# Patient Record
Sex: Female | Born: 1975 | Race: White | Hispanic: No
Health system: Midwestern US, Community
[De-identification: ages and names within clinical notes are randomized; demographics above are authoritative.]

## PROBLEM LIST (undated history)

## (undated) DIAGNOSIS — M797 Fibromyalgia: Secondary | ICD-10-CM

## (undated) DIAGNOSIS — M1990 Unspecified osteoarthritis, unspecified site: Secondary | ICD-10-CM

## (undated) DIAGNOSIS — F419 Anxiety disorder, unspecified: Secondary | ICD-10-CM

## (undated) DIAGNOSIS — J45909 Unspecified asthma, uncomplicated: Secondary | ICD-10-CM

## (undated) DIAGNOSIS — G629 Polyneuropathy, unspecified: Secondary | ICD-10-CM

## (undated) DIAGNOSIS — R69 Illness, unspecified: Secondary | ICD-10-CM

## (undated) DIAGNOSIS — M5137 Other intervertebral disc degeneration, lumbosacral region: Secondary | ICD-10-CM

## (undated) DIAGNOSIS — H6693 Otitis media, unspecified, bilateral: Secondary | ICD-10-CM

## (undated) DIAGNOSIS — F411 Generalized anxiety disorder: Secondary | ICD-10-CM

## (undated) DIAGNOSIS — F32A Depression, unspecified: Secondary | ICD-10-CM

## (undated) DIAGNOSIS — E559 Vitamin D deficiency, unspecified: Secondary | ICD-10-CM

## (undated) HISTORY — PX: OTHER SURGICAL HISTORY: SHX169

## (undated) HISTORY — PX: FACIAL FRACTURE SURGERY: SHX1570

---

## 2007-04-14 LAB — CBC WITH AUTOMATED DIFF
ABS. BASOPHILS: 0.1 10*3/uL (ref 0.0–0.1)
ABS. EOSINOPHILS: 0.2 10*3/uL (ref 0.0–0.4)
ABS. LYMPHOCYTES: 6.5 10*3/uL — ABNORMAL HIGH (ref 0.8–3.5)
ABS. MONOCYTES: 0.7 10*3/uL (ref 0–1.0)
ABS. NEUTROPHILS: 2.9 10*3/uL (ref 1.8–8.0)
BASOPHILS: 1 % (ref 0–1)
EOSINOPHILS: 2 % (ref 0–7)
HCT: 40.6 % (ref 35.0–47.0)
HGB: 13.8 g/dL (ref 11.5–16.0)
LYMPHOCYTES: 62 % — ABNORMAL HIGH (ref 12–49)
MCH: 32.5 PG (ref 26.0–34.0)
MCHC: 34 g/dL (ref 30.0–35.0)
MCV: 95.5 FL (ref 80.0–99.0)
MONOCYTES: 7 % (ref 5–13)
NEUTROPHILS: 28 % — ABNORMAL LOW (ref 32–75)
PLATELET: 232 10*3/uL (ref 150–400)
RBC: 4.25 M/uL (ref 3.80–5.20)
RDW: 12.6 % (ref 11.5–14.5)
WBC: 10.3 10*3/uL (ref 3.6–11.0)

## 2007-04-15 LAB — METABOLIC PANEL, COMPREHENSIVE
A-G Ratio: 0.9 — ABNORMAL LOW (ref 1.1–2.2)
ALT (SGPT): 34 U/L (ref 30–65)
AST (SGOT): 18 U/L (ref 15–37)
Albumin: 3.5 g/dL (ref 3.5–5.0)
Alk. phosphatase: 77 U/L (ref 50–136)
Anion gap: 11 mmol/L (ref 5–15)
BUN/Creatinine ratio: 20 (ref 12–20)
BUN: 16 MG/DL (ref 6–20)
Bilirubin, total: 0.2 MG/DL (ref <1.0)
CO2: 25 MMOL/L (ref 21–32)
Calcium: 9 MG/DL (ref 8.5–10.1)
Chloride: 106 MMOL/L (ref 97–108)
Creatinine: 0.8 MG/DL (ref 0.6–1.3)
GFR est AA: >60 mL/min/{1.73_m2} (ref >60)
GFR est non-AA: >60 mL/min/{1.73_m2} (ref >60)
Globulin: 4.1 g/dL — ABNORMAL HIGH (ref 2.0–4.0)
Glucose: 92 MG/DL (ref 65–105)
Potassium: 3.7 MMOL/L (ref 3.5–5.1)
Protein, total: 7.6 g/dL (ref 6.4–8.2)
Sodium: 142 MMOL/L (ref 136–145)

## 2007-04-15 LAB — URINALYSIS W/ REFLEX CULTURE
Bilirubin: NEGATIVE
Blood: NEGATIVE
Glucose: NEGATIVE MG/DL
Ketone: NEGATIVE MG/DL
Leukocyte Esterase: NEGATIVE
Nitrites: NEGATIVE
Protein: NEGATIVE MG/DL
Specific gravity: >1.03 — ABNORMAL HIGH (ref 1.003–1.030)
Urobilinogen: 0.2 EU/DL (ref 0.2–1.0)
pH (UA): 6 (ref 5.0–8.0)

## 2007-04-15 LAB — HCG URINE, QL: HCG urine, QL: NEGATIVE

## 2007-04-15 LAB — VALPROIC ACID: Valproic acid: 52 ug/ml (ref 50–100)

## 2007-04-15 LAB — DRUG SCREEN UR - NO CONFIRM
AMPHETAMINES: NEGATIVE
BARBITURATES: NEGATIVE
BENZODIAZEPINES: POSITIVE — AB
COCAINE: NEGATIVE
OPIATES: POSITIVE — AB
PCP(PHENCYCLIDINE): NEGATIVE
THC (TH-CANNABINOL): POSITIVE — AB
TRICYCLICS: NEGATIVE

## 2007-04-16 LAB — CULTURE, URINE
Colonies Counted: <1000
Colony Count: <1000
Culture result:: NO GROWTH
Culture: NO GROWTH

## 2007-04-30 LAB — METABOLIC PANEL, COMPREHENSIVE
A-G Ratio: 0.8 — ABNORMAL LOW (ref 1.1–2.2)
ALT (SGPT): 27 U/L — ABNORMAL LOW (ref 30–65)
AST (SGOT): 12 U/L — ABNORMAL LOW (ref 15–37)
Albumin: 3.8 g/dL (ref 3.5–5.0)
Alk. phosphatase: 74 U/L (ref 50–136)
Anion gap: 9 mmol/L (ref 5–15)
BUN/Creatinine ratio: 14 (ref 12–20)
BUN: 14 MG/DL (ref 6–20)
Bilirubin, total: 0.3 MG/DL (ref <1.0)
CO2: 24 MMOL/L (ref 21–32)
Calcium: 8.9 MG/DL (ref 8.5–10.1)
Chloride: 106 MMOL/L (ref 97–108)
Creatinine: 1 MG/DL (ref 0.6–1.3)
GFR est AA: >60 mL/min/{1.73_m2} (ref >60)
GFR est non-AA: >60 mL/min/{1.73_m2} (ref >60)
Globulin: 4.6 g/dL — ABNORMAL HIGH (ref 2.0–4.0)
Glucose: 96 MG/DL (ref 50–100)
Potassium: 3.7 MMOL/L (ref 3.5–5.1)
Protein, total: 8.4 g/dL — ABNORMAL HIGH (ref 6.4–8.2)
Sodium: 139 MMOL/L (ref 136–145)

## 2007-04-30 LAB — URINALYSIS W/MICROSCOPIC
Bilirubin: NEGATIVE
Blood: NEGATIVE
Glucose: NEGATIVE MG/DL
Ketone: NEGATIVE MG/DL
Nitrites: NEGATIVE
Protein: NEGATIVE MG/DL
Specific gravity: 1.013 (ref 1.003–1.030)
Urobilinogen: 0.2 EU/DL (ref 0.2–1.0)
pH (UA): 7 (ref 5.0–8.0)

## 2007-04-30 LAB — CBC WITH AUTOMATED DIFF
ABS. BASOPHILS: 0.1 10*3/uL (ref 0.0–0.1)
ABS. EOSINOPHILS: 0.2 10*3/uL (ref 0.0–0.4)
ABS. LYMPHOCYTES: 3.1 10*3/uL (ref 0.8–3.5)
ABS. MONOCYTES: 0.6 10*3/uL (ref 0–1.0)
ABS. NEUTROPHILS: 3.9 10*3/uL (ref 1.8–8.0)
BASOPHILS: 1 % (ref 0–1)
EOSINOPHILS: 2 % (ref 0–7)
HCT: 44.4 % (ref 35.0–47.0)
HGB: 15 g/dL (ref 11.5–16.0)
LYMPHOCYTES: 40 % (ref 12–49)
MCH: 32.7 PG (ref 26.0–34.0)
MCHC: 33.8 g/dL (ref 30.0–35.0)
MCV: 96.7 FL (ref 80.0–99.0)
MONOCYTES: 8 % (ref 5–13)
NEUTROPHILS: 49 % (ref 32–75)
PLATELET: 282 10*3/uL (ref 150–400)
RBC: 4.59 M/uL (ref 3.80–5.20)
RDW: 12.9 % (ref 11.5–14.5)
WBC: 7.8 10*3/uL (ref 3.6–11.0)

## 2007-04-30 LAB — VALPROIC ACID: Valproic acid: 73 ug/ml (ref 50–100)

## 2007-04-30 LAB — HCG URINE, QL: HCG urine, QL: NEGATIVE

## 2007-04-30 LAB — MAGNESIUM: Magnesium: 2 MG/DL (ref 1.6–2.4)

## 2007-06-27 LAB — URINALYSIS W/MICROSCOPIC
Bacteria: NEGATIVE /HPF
Bilirubin: NEGATIVE
Glucose: NEGATIVE MG/DL
Ketone: NEGATIVE MG/DL
Leukocyte Esterase: NEGATIVE
Nitrites: NEGATIVE
Protein: NEGATIVE MG/DL
Specific gravity: 1.008 (ref 1.003–1.030)
Urobilinogen: 0.2 EU/DL (ref 0.2–1.0)
pH (UA): 6.5 (ref 5.0–8.0)

## 2007-06-27 LAB — METABOLIC PANEL, COMPREHENSIVE
A-G Ratio: 1 — ABNORMAL LOW (ref 1.1–2.2)
ALT (SGPT): 29 U/L — ABNORMAL LOW (ref 30–65)
AST (SGOT): 18 U/L (ref 15–37)
Albumin: 3.5 g/dL (ref 3.5–5.0)
Alk. phosphatase: 54 U/L (ref 50–136)
Anion gap: 6 mmol/L (ref 5–15)
BUN/Creatinine ratio: 16 (ref 12–20)
BUN: 13 mg/dL (ref 6–20)
Bilirubin, total: 0.2 MG/DL (ref <1.0)
CO2: 26 MMOL/L (ref 21–32)
Calcium: 8.5 MG/DL (ref 8.5–10.1)
Chloride: 109 MMOL/L — ABNORMAL HIGH (ref 97–108)
Creatinine: 0.8 mg/dL (ref 0.6–1.3)
GFR est AA: >60 mL/min/{1.73_m2} (ref >60)
GFR est non-AA: >60 mL/min/{1.73_m2} (ref >60)
Globulin: 3.6 g/dL (ref 2.0–4.0)
Glucose: 87 MG/DL (ref 50–100)
Potassium: 3.5 MMOL/L (ref 3.5–5.1)
Protein, total: 7.1 g/dL (ref 6.4–8.2)
Sodium: 141 MMOL/L (ref 136–145)

## 2007-06-27 LAB — CBC WITH AUTOMATED DIFF
ABS. BASOPHILS: 0.1 10*3/uL (ref 0.0–0.1)
ABS. EOSINOPHILS: 0.2 10*3/uL (ref 0.0–0.4)
ABS. LYMPHOCYTES: 6.7 10*3/uL — ABNORMAL HIGH (ref 0.8–3.5)
ABS. MONOCYTES: 0.9 10*3/uL (ref 0–1.0)
ABS. NEUTROPHILS: 4.2 10*3/uL (ref 1.8–8.0)
BASOPHILS: 0 % (ref 0–1)
EOSINOPHILS: 2 % (ref 0–7)
HCT: 37.1 % (ref 35.0–47.0)
HGB: 12.8 g/dL (ref 11.5–16.0)
LYMPHOCYTES: 56 % — ABNORMAL HIGH (ref 12–49)
MCH: 32.7 PG (ref 26.0–34.0)
MCHC: 34.5 g/dL (ref 30.0–35.0)
MCV: 94.9 FL (ref 80.0–99.0)
MONOCYTES: 7 % (ref 5–13)
NEUTROPHILS: 35 % (ref 32–75)
PLATELET: 249 10*3/uL (ref 150–400)
RBC: 3.91 M/uL (ref 3.80–5.20)
RDW: 12.8 % (ref 11.5–14.5)
WBC: 12.1 10*3/uL — ABNORMAL HIGH (ref 3.6–11.0)

## 2007-06-27 LAB — VALPROIC ACID: Valproic acid: 5 ug/ml — ABNORMAL LOW (ref 50–100)

## 2007-07-23 LAB — METABOLIC PANEL, COMPREHENSIVE
A-G Ratio: 1 — ABNORMAL LOW (ref 1.1–2.2)
ALT (SGPT): 27 U/L — ABNORMAL LOW (ref 30–65)
AST (SGOT): 14 U/L — ABNORMAL LOW (ref 15–37)
Albumin: 4.1 g/dL (ref 3.5–5.0)
Alk. phosphatase: 65 U/L (ref 50–136)
Anion gap: 9 mmol/L (ref 5–15)
BUN/Creatinine ratio: 16 (ref 12–20)
BUN: 14 MG/DL (ref 6–20)
Bilirubin, total: 0.3 MG/DL (ref <1.0)
CO2: 24 MMOL/L (ref 21–32)
Calcium: 9.4 MG/DL (ref 8.5–10.1)
Chloride: 107 MMOL/L (ref 97–108)
Creatinine: 0.9 MG/DL (ref 0.6–1.3)
GFR est AA: >60 mL/min/{1.73_m2} (ref >60)
GFR est non-AA: >60 mL/min/{1.73_m2} (ref >60)
Globulin: 4.3 g/dL — ABNORMAL HIGH (ref 2.0–4.0)
Glucose: 106 MG/DL — ABNORMAL HIGH (ref 50–100)
Potassium: 3.8 MMOL/L (ref 3.5–5.1)
Protein, total: 8.4 g/dL — ABNORMAL HIGH (ref 6.4–8.2)
Sodium: 140 MMOL/L (ref 136–145)

## 2007-07-23 LAB — URINALYSIS W/ REFLEX CULTURE
Blood: NEGATIVE
Glucose: NEGATIVE MG/DL
Leukocyte Esterase: NEGATIVE
Nitrites: NEGATIVE
Protein: NEGATIVE MG/DL
Specific gravity: 1.03 (ref 1.003–1.030)
Urobilinogen: 1 EU/DL (ref 0.2–1.0)
pH (UA): 6.5 (ref 5.0–8.0)

## 2007-07-23 LAB — BILIRUBIN, CONFIRM: Bilirubin UA, confirm: NEGATIVE

## 2007-07-23 LAB — VALPROIC ACID: Valproic acid: 91 ug/ml (ref 50–100)

## 2007-07-23 LAB — HCG URINE, QL: HCG urine, QL: NEGATIVE

## 2007-07-24 LAB — CULTURE, URINE
Colonies Counted: 70000
Colony Count: 70000

## 2007-11-06 LAB — METABOLIC PANEL, COMPREHENSIVE
A-G Ratio: 1 — ABNORMAL LOW (ref 1.1–2.2)
ALT (SGPT): 43 U/L (ref 30–65)
AST (SGOT): 15 U/L (ref 15–37)
Albumin: 4.1 g/dL (ref 3.5–5.0)
Alk. phosphatase: 68 U/L (ref 50–136)
Anion gap: 13 mmol/L (ref 5–15)
BUN/Creatinine ratio: 14 (ref 12–20)
BUN: 14 MG/DL (ref 6–20)
Bilirubin, total: 0.5 MG/DL (ref <1.0)
CO2: 21 MMOL/L (ref 21–32)
Calcium: 9.4 MG/DL (ref 8.5–10.1)
Chloride: 104 MMOL/L (ref 97–108)
Creatinine: 1 MG/DL (ref 0.6–1.3)
GFR est AA: >60 mL/min/{1.73_m2} (ref >60)
GFR est non-AA: >60 mL/min/{1.73_m2} (ref >60)
Globulin: 4.3 g/dL — ABNORMAL HIGH (ref 2.0–4.0)
Glucose: 112 MG/DL — ABNORMAL HIGH (ref 50–100)
Potassium: 3.5 MMOL/L (ref 3.5–5.1)
Protein, total: 8.4 g/dL — ABNORMAL HIGH (ref 6.4–8.2)
Sodium: 138 MMOL/L (ref 136–145)

## 2007-11-06 LAB — CBC WITH AUTOMATED DIFF
ABS. BASOPHILS: 0.1 10*3/uL (ref 0.0–0.1)
ABS. EOSINOPHILS: 0.1 10*3/uL (ref 0.0–0.4)
ABS. LYMPHOCYTES: 4.9 10*3/uL — ABNORMAL HIGH (ref 0.8–3.5)
ABS. MONOCYTES: 0.6 10*3/uL (ref 0–1.0)
ABS. NEUTROPHILS: 5.1 10*3/uL (ref 1.8–8.0)
BASOPHILS: 1 % (ref 0–1)
EOSINOPHILS: 1 % (ref 0–7)
HCT: 43 % (ref 35.0–47.0)
HGB: 15.2 g/dL (ref 11.5–16.0)
LYMPHOCYTES: 45 % (ref 12–49)
MCH: 33 PG (ref 26.0–34.0)
MCHC: 35.3 g/dL — ABNORMAL HIGH (ref 30.0–35.0)
MCV: 93.5 FL (ref 80.0–99.0)
MONOCYTES: 6 % (ref 5–13)
NEUTROPHILS: 47 % (ref 32–75)
PLATELET: 293 10*3/uL (ref 150–400)
RBC: 4.6 M/uL (ref 3.80–5.20)
RDW: 13.6 % (ref 11.5–14.5)
WBC: 10.8 10*3/uL (ref 3.6–11.0)

## 2007-11-06 LAB — CARBAMAZEPINE: Carbamazepine: <0.3 ug/mL — ABNORMAL LOW (ref 4–12)

## 2008-01-08 LAB — METABOLIC PANEL, COMPREHENSIVE
A-G Ratio: 1 — ABNORMAL LOW (ref 1.1–2.2)
ALT (SGPT): 31 U/L (ref 30–65)
AST (SGOT): 18 U/L (ref 15–37)
Albumin: 4.3 g/dL (ref 3.5–5.0)
Alk. phosphatase: 85 U/L (ref 50–136)
Anion gap: 13 mmol/L (ref 5–15)
BUN/Creatinine ratio: 17 (ref 12–20)
BUN: 15 MG/DL (ref 6–20)
Bilirubin, total: 0.6 MG/DL (ref <1.0)
CO2: 21 MMOL/L (ref 21–32)
Calcium: 9.8 MG/DL (ref 8.5–10.1)
Chloride: 106 MMOL/L (ref 97–108)
Creatinine: 0.9 MG/DL (ref 0.6–1.3)
GFR est AA: >60 mL/min/{1.73_m2} (ref >60)
GFR est non-AA: >60 mL/min/{1.73_m2} (ref >60)
Globulin: 4.1 g/dL — ABNORMAL HIGH (ref 2.0–4.0)
Glucose: 86 MG/DL (ref 50–100)
Potassium: 3.8 MMOL/L (ref 3.5–5.1)
Protein, total: 8.4 g/dL — ABNORMAL HIGH (ref 6.4–8.2)
Sodium: 140 MMOL/L (ref 136–145)

## 2008-01-08 LAB — CBC WITH AUTOMATED DIFF
ABS. BASOPHILS: 0.1 10*3/uL (ref 0.0–0.1)
ABS. EOSINOPHILS: 0.1 10*3/uL (ref 0.0–0.4)
ABS. LYMPHOCYTES: 4.9 10*3/uL — ABNORMAL HIGH (ref 0.8–3.5)
ABS. MONOCYTES: 0.7 10*3/uL (ref 0.0–1.0)
ABS. NEUTROPHILS: 6 10*3/uL (ref 1.8–8.0)
BASOPHILS: 0 % (ref 0–1)
EOSINOPHILS: 1 % (ref 0–7)
HCT: 47 % (ref 35.0–47.0)
HGB: 16 g/dL (ref 11.5–16.0)
LYMPHOCYTES: 41 % (ref 12–49)
MCH: 32.7 PG (ref 26.0–34.0)
MCHC: 34 g/dL (ref 30.0–36.5)
MCV: 96.1 FL (ref 80.0–99.0)
MONOCYTES: 6 % (ref 5–13)
NEUTROPHILS: 52 % (ref 32–75)
PLATELET: 266 10*3/uL (ref 150–400)
RBC: 4.89 M/uL (ref 3.80–5.20)
RDW: 12.7 % (ref 11.5–14.5)
WBC: 11.8 10*3/uL — ABNORMAL HIGH (ref 3.6–11.0)

## 2008-01-08 LAB — LIPASE: Lipase: 226 U/L (ref 114–286)

## 2008-01-09 LAB — URINALYSIS W/ REFLEX CULTURE
Blood: NEGATIVE
Glucose: NEGATIVE MG/DL
Ketone: 15 MG/DL — AB
Nitrites: NEGATIVE
Protein: NEGATIVE MG/DL
Specific gravity: 1.024 (ref 1.003–1.030)
Urobilinogen: 1 EU/DL (ref 0.2–1.0)
pH (UA): 6.5 (ref 5.0–8.0)

## 2008-01-09 LAB — HCG URINE, QL: HCG urine, QL: NEGATIVE

## 2008-01-09 LAB — BILIRUBIN, CONFIRM: Bilirubin UA, confirm: NEGATIVE

## 2008-01-10 LAB — CULTURE, URINE
Colonies Counted: 40000
Colony Count: 40000

## 2008-05-01 MED ORDER — HYDROCODONE-ACETAMINOPHEN 5 MG-500 MG TAB
5-500 mg | ORAL_TABLET | ORAL | Status: DC | PRN
Start: 2008-05-01 — End: 2008-05-09

## 2008-05-01 NOTE — ED Notes (Signed)
Pt sitting up on stretcher.  Son with pt at bedside.  Pt states pain in arm was getting better from left arm surgery and then after the incident with the cop states that the pain has been as worse as ever

## 2008-05-01 NOTE — ED Notes (Signed)
Patient presents to ED c/o L forearm/elbow/shoulder pain after being arrested by cops on Friday.  Patient reports prior L elbow fx and states "my arm popped 3x when the cop was cuffing me."

## 2008-05-01 NOTE — ED Provider Notes (Signed)
Patient is a 33 y.o. female presenting with arm pain. The history is provided by the patient.   Arm Pain   The current episode started more than 2 days ago. The pain is present in the left arm (wrist to just above elbow). The pain is at a severity of 10/10. Associated symptoms include limited range of motion. She has tried cold and OTC pain medications for the symptoms. The treatment provided no relief. There has been a history of trauma (Pt was arrested 3 days ago and states arms were twisted behind her back even though she told officer she had hx of L arm fx and surgery. She states elbow "popped" when office did this.).    Pt has plans to go to court about incident. She states she is ambidextrous but is having trouble using L arm secondary to pain.    Pt has no further complaints at this time.     Past Medical History   Diagnosis Date   ??? ADHD      suspicion for, not diagnosed   ??? Seizures    ??? Other ill-defined conditions      chronic ear disease   ??? Anxiety    ??? Panic attacks           Past Surgical History   Procedure Date   ??? Hx other surgical      L elbow surgery, ear surgery, tubal ligation           Family History   Problem Relation   ??? Diabetes Maternal Grandmother   ??? Heart Disease Maternal Grandmother   ??? Thyroid Disease Maternal Grandmother   ??? Migraines Maternal Grandmother   ??? Stroke Paternal Grandfather   ??? Heart Disease Paternal Grandfather          History   Social History   ??? Marital Status: Divorced     Spouse Name: N/A     Number of Children: N/A   ??? Years of Education: N/A   Occupational History   ??? Not on file.   Social History Main Topics   ??? Tobacco Use: Yes   ??? Alcohol Use: No   ??? Drug Use: No   ??? Sexually Active: Yes     Birth Control/ Protection: None   Other Topics Concern   ??? Not on file   Social History Narrative   ??? No narrative on file           ALLERGIES: Pcn, Sulfa (sulfonamide antibiotics), Toradol, Aspirin, Benadryl, Ciprofloxacin and Darvocet a500      Review of Systems    Constitutional: Negative.    HENT: Negative.    Eyes: Negative.    Respiratory: Negative.    Cardiovascular: Negative.    Gastrointestinal: Negative.    Genitourinary: Negative.    Musculoskeletal: Negative.         See HPI.   Neurological: Negative.    Hematological: Negative.    Psychiatric/Behavioral: Negative.        Filed Vitals:    05/01/2008  4:12 PM   BP: 118/70   Pulse: 76   Temp: 97.9 ??F (36.6 ??C)   Resp: 16   Height: 5' 4.5" (1.638 m)   Weight: 144 lb 10 oz (65.6 kg)   SpO2: 97%              Physical Exam   Nursing note and vitals reviewed.  Constitutional: She is oriented. She appears well-developed and well-nourished. No distress.   HENT:  Head: Normocephalic and atraumatic.   Eyes: Extraocular motions are normal. Pupils are equal, round, and reactive to light.   Neck: Normal range of motion. Neck supple.   Cardiovascular: Normal rate, regular rhythm, normal heart sounds and intact distal pulses.  Exam reveals no friction rub.    No murmur heard.  Pulmonary/Chest: Effort normal and breath sounds normal. No respiratory distress. She has no wheezes. She has no rales. She exhibits no tenderness.   Abdominal: Soft. Bowel sounds are normal. She exhibits no distension. No tenderness. She has no rebound and no guarding.   Musculoskeletal: Normal range of motion. She exhibits no edema and no tenderness.        There is tenderness to the left elbow. There is no swelling to the elbow as compared to the un-injured right elbow. With pronation and supenatio there is crepitus in the elbow. There is crepitus with flexion and extension.   Neurological: She is alert and oriented. She exhibits normal muscle tone. Coordination normal.   Skin: Skin is warm and dry. She is not diaphoretic. No pallor.   Psychiatric: She has a normal mood and affect. Her behavior is normal.            Coding      APPLY LONG ARM SPLINT   Date/Time: 05/01/2008 7:03 PM  Performed by: attending  Location details: left arm  Splint type: long arm   Patient tolerance: Patient tolerated the procedure well with no immediate complications.        6:37 PM  Consulted pt/family about sx, dx, tx and rx with good understanding. Care plan outlined and precautions discussed. There are no new complaints, changes, or physical finding. All pt/family's questions and concerns were addressed. Results of radiographic studies reviewed with the pt. Medications were reviewed with the patient. Pt is ready to go home. Pt will return to the ER with any further deterioration and will f/u with her PCP .

## 2008-05-01 NOTE — ED Notes (Signed)
Pt told d/c instructions.  She verbalized understanding and ambulaated out of the ED with a steeady gait holding d/c papers and Rx x1 in hand.  DSt. No acute distress.  Pt's adolescent sone with patient

## 2008-05-20 LAB — CBC WITH AUTOMATED DIFF
ABS. BASOPHILS: 0.1 10*3/uL (ref 0.0–0.1)
ABS. EOSINOPHILS: 0.2 10*3/uL (ref 0.0–0.4)
ABS. LYMPHOCYTES: 4.3 10*3/uL — ABNORMAL HIGH (ref 0.8–3.5)
ABS. MONOCYTES: 0.7 10*3/uL (ref 0.0–1.0)
ABS. NEUTROPHILS: 6.9 10*3/uL (ref 1.8–8.0)
BASOPHILS: 1 % (ref 0–1)
EOSINOPHILS: 2 % (ref 0–7)
HCT: 44 % (ref 35.0–47.0)
HGB: 15.3 g/dL (ref 11.5–16.0)
LYMPHOCYTES: 35 % (ref 12–49)
MCH: 33.2 PG (ref 26.0–34.0)
MCHC: 34.8 g/dL (ref 30.0–36.5)
MCV: 95.4 FL (ref 80.0–99.0)
MONOCYTES: 6 % (ref 5–13)
NEUTROPHILS: 56 % (ref 32–75)
PLATELET: 266 10*3/uL (ref 150–400)
RBC: 4.61 M/uL (ref 3.80–5.20)
RDW: 13.8 % (ref 11.5–14.5)
WBC COMMENTS: REACTIVE
WBC: 12.2 10*3/uL — ABNORMAL HIGH (ref 3.6–11.0)

## 2008-05-20 LAB — URINALYSIS W/ REFLEX CULTURE
Bacteria: NEGATIVE /HPF
Bilirubin: NEGATIVE
Blood: NEGATIVE
Glucose: NEGATIVE MG/DL
Ketone: NEGATIVE MG/DL
Leukocyte Esterase: NEGATIVE
Nitrites: NEGATIVE
Protein: NEGATIVE MG/DL
Specific gravity: 1.019 (ref 1.003–1.030)
Urobilinogen: 1 EU/DL (ref 0.2–1.0)
pH (UA): 7 (ref 5.0–8.0)

## 2008-05-20 LAB — METABOLIC PANEL, COMPREHENSIVE
A-G Ratio: 1.1 (ref 1.1–2.2)
ALT (SGPT): 31 U/L (ref 12–78)
AST (SGOT): 15 U/L (ref 15–37)
Albumin: 3.8 g/dL (ref 3.5–5.0)
Alk. phosphatase: 75 U/L (ref 50–136)
Anion gap: 12 mmol/L (ref 5–15)
BUN/Creatinine ratio: 10 — ABNORMAL LOW (ref 12–20)
BUN: 8 MG/DL (ref 6–20)
Bilirubin, total: 0.4 MG/DL (ref 0.2–1.0)
CO2: 22 MMOL/L (ref 21–32)
Calcium: 9 MG/DL (ref 8.5–10.1)
Chloride: 107 MMOL/L (ref 97–108)
Creatinine: 0.8 MG/DL (ref 0.6–1.3)
GFR est AA: >60 mL/min/{1.73_m2} (ref >60)
GFR est non-AA: >60 mL/min/{1.73_m2} (ref >60)
Globulin: 3.6 g/dL (ref 2.0–4.0)
Glucose: 88 MG/DL (ref 65–100)
Potassium: 3.4 MMOL/L — ABNORMAL LOW (ref 3.5–5.1)
Protein, total: 7.4 g/dL (ref 6.4–8.2)
Sodium: 141 MMOL/L (ref 136–145)

## 2008-05-20 LAB — POC URINE PREGNANCY TEST: Pregnancy test,urine (POC): NEGATIVE

## 2008-05-20 LAB — CARBAMAZEPINE: Carbamazepine: <0.5 ug/mL — ABNORMAL LOW (ref 4.0–12.0)

## 2008-05-20 LAB — MAGNESIUM: Magnesium: 1.8 MG/DL (ref 1.6–2.4)

## 2008-05-20 MED ORDER — ONDANSETRON (PF) 4 MG/2 ML INJECTION
4 mg/2 mL | Freq: Once | INTRAMUSCULAR | Status: AC
Start: 2008-05-20 — End: 2008-05-20
  Administered 2008-05-20: 16:00:00 via INTRAVENOUS

## 2008-05-20 MED ORDER — MORPHINE 2 MG/ML INJECTION
2 mg/mL | Freq: Once | INTRAMUSCULAR | Status: AC
Start: 2008-05-20 — End: 2008-05-20
  Administered 2008-05-20: 19:00:00 via INTRAVENOUS

## 2008-05-20 MED ORDER — OXYCODONE-ACETAMINOPHEN 5 MG-325 MG TAB
5-325 mg | ORAL_TABLET | ORAL | Status: AC | PRN
Start: 2008-05-20 — End: 2008-05-27

## 2008-05-20 MED ORDER — LORAZEPAM 2 MG/ML IJ SOLN
2 mg/mL | INTRAMUSCULAR | Status: AC
Start: 2008-05-20 — End: 2008-05-20
  Administered 2008-05-20: 14:00:00 via INTRAMUSCULAR

## 2008-05-20 MED ORDER — SODIUM CHLORIDE 0.9% BOLUS IV
0.9 % | Freq: Once | INTRAVENOUS | Status: AC
Start: 2008-05-20 — End: 2008-05-20
  Administered 2008-05-20: 19:00:00 via INTRAVENOUS

## 2008-05-20 MED ORDER — PROCHLORPERAZINE EDISYLATE 5 MG/ML INJECTION
5 mg/mL | INTRAMUSCULAR | Status: AC
Start: 2008-05-20 — End: 2008-05-20
  Administered 2008-05-20: 19:00:00 via INTRAVENOUS

## 2008-05-20 MED ORDER — LORAZEPAM 2 MG/ML IJ SOLN
2 mg/mL | INTRAMUSCULAR | Status: AC
Start: 2008-05-20 — End: 2008-05-20

## 2008-05-20 MED ORDER — LORAZEPAM 2 MG/ML IJ SOLN
2 mg/mL | INTRAMUSCULAR | Status: DC
Start: 2008-05-20 — End: 2008-05-20

## 2008-05-20 MED ORDER — HYDROMORPHONE (PF) 1 MG/ML IJ SOLN
1 mg/mL | Freq: Once | INTRAMUSCULAR | Status: AC
Start: 2008-05-20 — End: 2008-05-20
  Administered 2008-05-20: 20:00:00 via INTRAVENOUS

## 2008-05-20 MED ORDER — LORAZEPAM 2 MG/ML IJ SOLN
2 mg/mL | Freq: Once | INTRAMUSCULAR | Status: AC
Start: 2008-05-20 — End: 2008-05-20

## 2008-05-20 MED ORDER — MORPHINE 2 MG/ML INJECTION
2 mg/mL | INTRAMUSCULAR | Status: AC
Start: 2008-05-20 — End: 2008-05-20
  Administered 2008-05-20: 17:00:00 via INTRAVENOUS

## 2008-05-20 MED ORDER — SALINE PERIPHERAL FLUSH PRN
INTRAMUSCULAR | Status: DC | PRN
Start: 2008-05-20 — End: 2008-05-20
  Administered 2008-05-20: 19:00:00

## 2008-05-20 MED ORDER — BUTALBITAL-ACETAMINOPHEN-CAFFEINE 50 MG-325 MG-40 MG TAB
50-325-40 mg | ORAL | Status: AC
Start: 2008-05-20 — End: 2008-05-20
  Administered 2008-05-20: 19:00:00 via ORAL

## 2008-05-20 MED ORDER — SODIUM CHLORIDE 0.9% BOLUS IV
0.9 % | Freq: Once | INTRAVENOUS | Status: AC
Start: 2008-05-20 — End: 2008-05-20
  Administered 2008-05-20: 15:00:00 via INTRAVENOUS

## 2008-05-20 MED ORDER — LORAZEPAM 2 MG/ML IJ SOLN
2 mg/mL | INTRAMUSCULAR | Status: AC
Start: 2008-05-20 — End: 2008-05-20
  Administered 2008-05-20: 15:00:00

## 2008-05-20 MED ORDER — CARBAMAZEPINE 200 MG TAB
200 mg | ORAL | Status: AC
Start: 2008-05-20 — End: 2008-05-20
  Administered 2008-05-20: 19:00:00 via ORAL

## 2008-05-20 MED ORDER — OXYCODONE-ACETAMINOPHEN 5 MG-325 MG TAB
5-325 mg | Freq: Once | ORAL | Status: AC
Start: 2008-05-20 — End: 2008-05-20
  Administered 2008-05-20: 16:00:00 via ORAL

## 2008-05-20 MED FILL — SODIUM CHLORIDE 0.9 % IV: INTRAVENOUS | Qty: 500

## 2008-05-20 MED FILL — LORAZEPAM 2 MG/ML IJ SOLN: 2 mg/mL | INTRAMUSCULAR | Qty: 1

## 2008-05-20 MED FILL — MORPHINE 2 MG/ML INJECTION: 2 mg/mL | INTRAMUSCULAR | Qty: 1

## 2008-05-20 MED FILL — CARBAMAZEPINE 200 MG TAB: 200 mg | ORAL | Qty: 2

## 2008-05-20 MED FILL — HYDROMORPHONE (PF) 1 MG/ML IJ SOLN: 1 mg/mL | INTRAMUSCULAR | Qty: 1

## 2008-05-20 MED FILL — SALINE FLUSH INJECTION SYRINGE: INTRAMUSCULAR | Qty: 10

## 2008-05-20 MED FILL — SODIUM CHLORIDE 0.9 % IV: INTRAVENOUS | Qty: 1000

## 2008-05-20 MED FILL — BUTALBITAL-ACETAMINOPHEN-CAFFEINE 50 MG-325 MG-40 MG TAB: 50-325-40 mg | ORAL | Qty: 1

## 2008-05-20 MED FILL — PROCHLORPERAZINE EDISYLATE 5 MG/ML INJECTION: 5 mg/mL | INTRAMUSCULAR | Qty: 2

## 2008-05-20 MED FILL — MORPHINE 2 MG/ML INJECTION: 2 mg/mL | INTRAMUSCULAR | Qty: 2

## 2008-05-20 MED FILL — ONDANSETRON (PF) 4 MG/2 ML INJECTION: 4 mg/2 mL | INTRAMUSCULAR | Qty: 2

## 2008-05-20 MED FILL — OXYCODONE-ACETAMINOPHEN 5 MG-325 MG TAB: 5-325 mg | ORAL | Qty: 1

## 2008-05-20 NOTE — ED Provider Notes (Signed)
HPI Comments: 33 y.o. female presents by private vehicle to ED with a c/o HA and lump on back of head x yesterday.  Upon Dr. Katrinka Blazing arrival to room pt, pt began having convulsions. At that time, family member reports pt went to [facility] yesterday where he thought she may have fallen given the lump on the back of her head and states pt has been c/o severe HA x yesterday. After pt stopped seizing, Dr. Katrinka Blazing reports pt was A&O x 3 and interviewed pt who reports she has severe HA x yesterday and denies every falling or any head trauma. States her last seizure was October 27, 2024when her mom died.  Pt reports associated sx's of neck pain although pt states she think it might be due to seizure.     Family member reports pt has not had anything to eat today and little to drink.    History of migraines: yes    Seizure medications: Topomax, Valium    Taking medication as directed: yes    There are no other sx???s or complaints noted at this time.           The history is provided by a relative.        Past Medical History   Diagnosis Date   ??? Arthritis    ??? Other ill-defined conditions      fibromyalgia   ??? Other ill-defined conditions      chronic ear disease    ??? Seizures           Past Surgical History   Procedure Date   ??? Hx orthopaedic      left arm    ??? Hx heent            No family history on file.     History   Social History   ??? Marital Status: Single     Spouse Name: N/A     Number of Children: N/A   ??? Years of Education: N/A   Occupational History   ??? Not on file.   Social History Main Topics   ??? Tobacco Use: Yes   ??? Alcohol Use: No   ??? Drug Use:    ??? Sexually Active:    Other Topics Concern   ??? Not on file   Social History Narrative   ??? No narrative on file           ALLERGIES: Pcn, Sulfa (sulfonamide antibiotics), Ketorolac, Ciprofloxacin, Benadryl and Darvocet a500      Review of Systems   HENT: Positive for neck pain.    Neurological: Positive for seizures and headaches.   All other systems reviewed and are negative.         Filed Vitals:    05/20/2008  9:20 AM   BP: 109/72   Pulse: 96   Temp: 98.1 ??F (36.7 ??C)   Resp: 18   Height: 5\' 4"  (1.626 m)   Weight: 136 lb 3.9 oz (61.8 kg)   SpO2: 96%              Physical Exam   Nursing note and vitals reviewed.  Constitutional: She is oriented. She appears well-developed and well-nourished. She appears distressed.   HENT:   Head: Normocephalic.        Possible contusion occiput   Eyes: Extraocular motions are normal. Pupils are equal, round, and reactive to light.   Neck: Normal range of motion. Neck supple.   Cardiovascular: Normal rate, regular rhythm,  normal heart sounds and intact distal pulses.  Exam reveals no friction rub.    No murmur heard.  Pulmonary/Chest: Effort normal and breath sounds normal. No respiratory distress. She has no wheezes. She has no rales. She exhibits no tenderness.   Abdominal: Soft. Bowel sounds are normal. She exhibits no distension. No tenderness. She has no rebound and no guarding.   Musculoskeletal: Normal range of motion. She exhibits no edema and no tenderness.   Neurological: She is alert and oriented. She exhibits normal muscle tone. Coordination normal.   Skin: Skin is warm and dry. She is not diaphoretic. No pallor.   Psychiatric: She has a normal mood and affect. Her behavior is normal.        Patient has 60 second durations of shaking; after these she is A & O x 3 immediately            Coding      Procedures    11:20 AM Patient has been re-examined. Pt has had 3 seizures in room and is A&O x 3 after each.    3:26 PM Patient has been re-examined. Reports HA is down to 6/10. Pt has not had any shaking/seizure activity since her last episode.      4:00 PM Patient has been re-examined. Reports she is feeling much better and will f/u with her Neurologist Dr. Roxine Caddy as advised. There are no new complaints, changes or physical findings. Patient's lab and radiology results reviewed with patient/family. All medications were reviewed with patient/family. Patient/family questions were answered. Patient/family agrees to f/u as discussed. Patient/family understands dx, tx, rx and agrees with care plan. Patient/family is instructed to return to ED with any further deterioration. Ready to go.

## 2008-05-20 NOTE — ED Notes (Signed)
Lab called and stated that the specimen for carbamazepine has hemolyzed, requested that the order be re-ordered and redrawn

## 2008-05-20 NOTE — ED Notes (Signed)
Nurse into check on pt.  Pt sitting up in bed talking on cell phone.  Remains on MONX3 at this time.

## 2008-05-20 NOTE — Progress Notes (Signed)
I have reviewed discharge instructions with the patient.  The patient verbalized understanding.Patient ambulatory upon DC with  husband

## 2008-05-20 NOTE — ED Notes (Signed)
Dr. Smith into speak with pt

## 2008-05-20 NOTE — ED Notes (Signed)
TRANSFER - OUT REPORT:    Verbal report given to A. Sheliah Hatch, RN on Juanelle Trueheart  being transferred to CDU (unit) for routine progression of care       Report consisted of patient???s Situation, Background, Assessment and   Recommendations(SBAR).     Information from the following report(s) SBAR, Kardex and MAR was reviewed with the receiving nurse.    Opportunity for questions and clarification was provided.

## 2008-05-20 NOTE — ED Notes (Signed)
Dr. Katrinka Blazing into see pt due to seizure activity again

## 2008-05-20 NOTE — ED Notes (Signed)
Nurse spoke with patient care tech, states that when drawing labs she drew two red tops.  Nurse called back to lab, spoke with Selena Batten, aware that there are two red tops and she will run the specimen

## 2008-05-20 NOTE — ED Notes (Signed)
Pt had a second seizure while nurse in room.  Nurse gave pt dose of ativan per verbal order from Dr. Katrinka Blazing.

## 2008-05-20 NOTE — ED Notes (Signed)
Assumed care of patient, report from ER RN. To CDU by stretcher. VS reassessed. Medicated for pain.

## 2008-05-20 NOTE — ED Notes (Signed)
Pts family member out to nurses station, states, "She thinks she is about to have a seizure."  Nurse into room, placed pts head of bed flat and all side-rails up and locked.  Pt then began to start shaking and was unresponsive.  Nurse notified Dr. Katrinka Blazing who came into see pt

## 2008-05-28 NOTE — ED Notes (Signed)
Pt states that she was moving furniture at home and a dresser fell on her.  Her lower back down to buttock is painful.  Will continue to monitor.

## 2008-05-28 NOTE — ED Notes (Signed)
Pt spouse stated pt has another "seizure" like activity.  Lasting less than 30 seconds, no urinary incontinence, and is able to answer questions about surroundings after episode.  PAC at bedside will continue to monitor.

## 2008-05-28 NOTE — ED Provider Notes (Signed)
Patient is a 33 y.o. female presenting with back pain. The history is provided by the patient and the spouse.   Back Pain   This is a new problem. Episode onset: x 1500. Associated With: Pt states she was attempting to move a dresser when it fell over and hit her back. The pain is present in the lower back. The pain does not radiate. Associated symptoms include numbness (in bilateral feet), headaches and tingling (in bilateral feet). Pertinent negatives include no abdominal pain, no bowel incontinence, no bladder incontinence, no dysuria and no leg pain.   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 Tiburcio [name]. Pt had one seizure during exam and was able to wake, identify surroundings, and had no bowel or bladder incontinence.    PCP: Shannon [name]  Neuro: Shannon [name]  Social Hx: + tobacco, - alcohol    Pt has no further sx's or complaints at this time.    Past Medical History   Diagnosis Date   ??? Arthritis    ??? Other ill-defined conditions      fibromyalgia   ??? Other ill-defined conditions      chronic ear disease    ??? Seizures           Past Surgical History   Procedure Date   ??? Hx orthopaedic      left arm    ??? Hx heent            No family history on file.     History   Social History   ??? Marital Status: Single     Spouse Name: N/A     Number of Children: N/A   ??? Years of Education: N/A   Occupational History   ??? Not on file.   Social History Main Topics   ??? Tobacco Use: Yes -- 0.5 packs/day   ??? Alcohol Use: No   ??? Drug Use:    ??? Sexually Active: Yes -- Female partner(s)   Other Topics Concern   ??? Not on file   Social History Narrative   ??? No narrative on file           ALLERGIES: Pcn, Sulfa (sulfonamide antibiotics), Ketorolac, Ciprofloxacin, Benadryl, Darvocet a500 and Aspirin      Review of Systems   Constitutional: Negative.    Eyes: Negative.    Respiratory: Negative.    Cardiovascular: Negative.     Gastrointestinal: Negative.  Negative for abdominal pain.   Genitourinary: Negative.  Negative for bladder incontinence, dysuria and hematuria.   Musculoskeletal: Positive for back pain.   Skin: Negative.    Neurological: Positive for tingling (in bilateral feet), numbness (in bilateral feet) and headaches.   Hematological: Negative.    Psychiatric/Behavioral: Negative.    All other systems reviewed and are negative.        Filed Vitals:    05/28/2008  9:20 PM   BP: 103/63   Pulse: 61   Temp: 98.3 ??F (36.8 ??C)   Resp: 20   Height: 5' 4.5" (1.638 m)   Weight: 140 lb (63.504 kg)   SpO2: 98%              Physical Exam   Nursing note and vitals reviewed.  Constitutional: She is oriented. She appears well-developed and well-nourished.        In moderate discomfort.    HENT:   Head: Normocephalic and atraumatic.  Right Ear: External ear normal.   Left Ear: External ear normal.   Nose: Nose normal.   Mouth/Throat: Oropharynx is clear and moist. No oropharyngeal exudate.   Eyes: Extraocular motions are normal. Pupils are equal, round, and reactive to light.   Neck: Normal range of motion. Neck supple.   Cardiovascular: Normal rate, regular rhythm, normal heart sounds and intact distal pulses.  Exam reveals no friction rub.    No murmur heard.  Pulmonary/Chest: Effort normal and breath sounds normal. No respiratory distress. She has no wheezes. She has no rales. She exhibits no tenderness.   Abdominal: Soft. Bowel sounds are normal. She exhibits no distension. No tenderness. She has no rebound and no guarding.   Musculoskeletal: Normal range of motion. She exhibits no edema and no tenderness.        Diffuse low back pain more focal to left lumbosacral areas. No swelling or discoloration no deformity no lesions. No step off. Neg SLR neg EHL neg FABER> reflexes intact. Pt unwilling to ambulate secondary to pain. Able to support herself on her knees without diff on stretcher     Neurological: She is alert and oriented. She exhibits normal muscle tone. Coordination normal.        Pt with 30 second episode of rhythmic "seizure" like activity without incontinence, Pt immediately awakens to full awareness of surrounding environment. Speaks without diff.    Skin: Skin is warm and dry. She is not diaphoretic. No pallor.   Psychiatric:        Mood labile. Affect anxious. Tearful. belligerent at times.             Coding      Procedures  11:42 PM  Pt refused X-ray until pain medication was administered.    2:33 AM  Pt tearful when discussing the amount of narcotic pain medications she is on. Pt states the care her PCP gives is none of the ED's concern so they should not be investigating her previous records. Pt also states care team does not know her history, so they don't know how much pain she is in. Pt is belligerent, yelling and cursing into hallway; ignoring spouse's attempts to soothe her.    2:45 AM  Patient consulted about sx, dx, tx, and rx with good understanding. Care plan outlined and precautions discussed. Patient has been re-examined. There are no new complaints, changes, or physical findings. The patient???s lab results were reviewed with the patient. The patient's radiographic studies were reviewed with the patient. The patient's medication(s) was/were reviewed with the patient. Patient's questions were answered. Patient agrees with care plans as outlined and agrees to follow up with Dr. Genia [name] as discussed. Patient instructed and agreed to return to the ED for any concern or deterioration. Patient is ready to go home.    3:01 AM  Discussed pt care with Dr. Hinton [name]. Dr. Hinton [name] agrees with care plan and will see pt.    3:08 AM   Dr. Hinton [name] chose not to give the pt Rx narcotics or benzodiazepines. Pt states she does not care and will get medications from her PCP tomorrow. Pt also yelling and threatening to sue her entire care team for violating her civil rights and subpoena all persons present in the ED during her visit as her husband attempted to usher her out of ED, saying "would you just go, stop yelling at them so we can leave."

## 2008-05-28 NOTE — ED Notes (Signed)
Pt started to convulse, lasting 45sec.  Pt responding to questions after "seizure" activity.  Liborio Nixon, [facility] notified of episode.  Will continue to monitor.

## 2008-05-29 LAB — URINALYSIS W/ REFLEX CULTURE
Bilirubin: NEGATIVE
Blood: NEGATIVE
Glucose: NEGATIVE MG/DL
Nitrites: POSITIVE — AB
Protein: NEGATIVE MG/DL
Specific gravity: 1.018 (ref 1.003–1.030)
Urobilinogen: 1 EU/DL (ref 0.2–1.0)
pH (UA): 6 (ref 5.0–8.0)

## 2008-05-29 LAB — DRUG SCREEN, URINE
AMPHETAMINES: NEGATIVE
BARBITURATES: POSITIVE — AB
BENZODIAZEPINES: POSITIVE — AB
COCAINE: NEGATIVE
OPIATES: POSITIVE — AB
PCP(PHENCYCLIDINE): NEGATIVE
THC (TH-CANNABINOL): POSITIVE — AB
TRICYCLICS: NEGATIVE

## 2008-05-29 LAB — HCG URINE, QL: HCG urine, QL: NEGATIVE

## 2008-05-29 MED ORDER — DIAZEPAM 5 MG TAB
5 mg | ORAL | Status: AC
Start: 2008-05-29 — End: 2008-05-29
  Administered 2008-05-29: 07:00:00 via ORAL

## 2008-05-29 MED ORDER — OXYCODONE-ACETAMINOPHEN 5 MG-325 MG TAB
5-325 mg | ORAL_TABLET | ORAL | Status: DC | PRN
Start: 2008-05-29 — End: 2008-06-30

## 2008-05-29 MED ORDER — NITROFURANTOIN (25% MACROCRYSTAL FORM) 100 MG CAP
100 mg | ORAL | Status: AC
Start: 2008-05-29 — End: 2008-05-28
  Administered 2008-05-29: 04:00:00 via ORAL

## 2008-05-29 MED ORDER — NITROFURANTOIN (25% MACROCRYSTAL FORM) 100 MG CAP
100 mg | ORAL_CAPSULE | Freq: Two times a day (BID) | ORAL | Status: AC
Start: 2008-05-29 — End: 2008-06-08

## 2008-05-29 MED ORDER — DIAZEPAM 5 MG TAB
5 mg | ORAL_TABLET | Freq: Three times a day (TID) | ORAL | Status: DC | PRN
Start: 2008-05-29 — End: 2010-09-07

## 2008-05-29 MED ORDER — ONDANSETRON 4 MG TAB, RAPID DISSOLVE
4 mg | ORAL | Status: AC
Start: 2008-05-29 — End: 2008-05-28
  Administered 2008-05-29: 04:00:00 via ORAL

## 2008-05-29 MED ORDER — MORPHINE 4 MG/ML SYRINGE
4 mg/mL | INTRAMUSCULAR | Status: AC
Start: 2008-05-29 — End: 2008-05-28
  Administered 2008-05-29: 04:00:00 via INTRAMUSCULAR

## 2008-05-29 MED FILL — DIAZEPAM 5 MG TAB: 5 mg | ORAL | Qty: 1

## 2008-05-29 MED FILL — MACROBID 100 MG CAPSULE: 100 mg | ORAL | Qty: 1

## 2008-05-29 MED FILL — MORPHINE 4 MG/ML SYRINGE: 4 mg/mL | INTRAMUSCULAR | Qty: 1

## 2008-05-29 MED FILL — ONDANSETRON 4 MG TAB, RAPID DISSOLVE: 4 mg | ORAL | Qty: 1

## 2008-05-29 NOTE — ED Notes (Addendum)
Pt husband came to nurses station and asked if we could get her discharge papers so he could "get the hell away from her".  Pt is loud and continues to argue that we do not know her medical history and that we just don't know how much pain she is in.  She then started to argue that she was upset with Liborio Nixon, [place] for obtaining her medical history so we could better understand her pain history.

## 2008-05-29 NOTE — ED Notes (Signed)
Quick, PA states that she would like to see the results of the CT and xray, aware of headache.

## 2008-05-29 NOTE — ED Notes (Signed)
Pt resting in bed, denies nausea at this time.  Respirations even, states she is feeling a little better, but still has a headache and her back is still sore.  PA notified and will continue to monitor

## 2008-05-29 NOTE — ED Notes (Signed)
Pt resting in bed, states her lower back is still painful, A&Ox3.  Respirations even and moderate.  Will continue to monitor.

## 2008-05-29 NOTE — ED Notes (Signed)
3:20 AM  Discussed patients diagnosis, chronic pain history, and her displeasure with the care she received this evening.  I apologized profusely and accepted blame for the perception of poor care she was provided.  She states that has an appoint with Dr. Norva Karvonen tomorrow, her PCP, and he will give her whatever she wants for pain.  I apologized again and empathics with her chronic medical problems and conveyed my lack of expertise in evaluating and treating these chronic issues but would be happy to treat her pain if she would like to stay longer until it was under control.  I additionally conveyed that the department would no longer be able to treat her chronic pain medication refills as she is entering a practice of a local pain management specialist and she states she will be signing a contract with this physician.

## 2008-05-29 NOTE — ED Notes (Signed)
Pt ambulated out of ER upon discharge with minimal assistance from spouse

## 2008-05-29 NOTE — ED Notes (Signed)
Pt is lying in bed, very loudly is yelling in her room, her door has been closed several times and asked to quiet down due to other pt near by.  Pt continues to be beligerant and yelling obscenities in room.  PA notified and will continue to monitor.

## 2008-05-29 NOTE — ED Notes (Signed)
DC instructions were given to pt by Dr. Hinton Rao.  Pt states that she just wants her walking papers so she can "get the hell out of here".  States that she does not care about getting pain prescriptions, but just wants to feel better now.  Pt did not sign to paperwork, just just stormed out of the room.

## 2008-05-30 LAB — CULTURE, URINE
Colonies Counted: >100000
Colony Count: >100000

## 2008-06-03 NOTE — ED Notes (Signed)
Patient observed standing and conversive without difficulty while staff out of the room, but when we entered the room she became bent over and crying in pain.  I reviewed the patients [place] prescription monitoring record with her and given the nature of her presentation, her witness bouts of being morbid free when not around staff and her multiple prescriptions from multiple physicians for narcotics, and the fact that patient has a PCP that treats her chronic back pain,  I feel comfortable in allowing the patient to see her PCP in the morning for better continuity of care with this medical condition.  I was personally available for consultation in the emergency department.  I have reviewed the chart and agree with the documentation recorded by the [facility], including the assessment, treatment plan, and disposition.  Keyondre Hepburn, L. Hinton Rao, MD

## 2008-06-30 NOTE — ED Notes (Signed)
Pt refusing Tylenol stating it is not going to help her.  Pt yelling saying she needs narcotics for the pn or she will have a seizure.

## 2008-06-30 NOTE — ED Notes (Signed)
Pt boyfriend states pt is having seizure.  Upon entering room pt is shaking all over with eyes closed.  Opened eyelids to check pupillary response and pt flinched during seizure like activity.  After shaking episode pt was A&Ox3 and was not post ictal.

## 2008-06-30 NOTE — ED Notes (Signed)
Pt resting.  Boyfriend at bedside.  Requesting pn meds.

## 2008-06-30 NOTE — ED Provider Notes (Signed)
Patient is a 33 y.o. female presenting with seizures. The history is provided by the patient, the spouse and the EMS personnel. No language interpreter was used.   Seizure   Associated symptoms include headaches and chest pain. Pertinent negatives include no cough, no nausea, no vomiting and no diarrhea.   She reports chest pain and headaches.  She reports no diarrhea, no vomiting and no cough.   Per fiance, patient was complaining of CP and L arm tingling when she became unresponsive and convulsive. Fiance notes patient stopped breathing while having seizure. Fiance states he phoned for EMS while a friend administered mouth-to-mouth. Fiance notes patient started breathing and coughing before seizing again. Mouth-to-mouth was re-initiated by friend and patient was post-ictal at scene upon arrival, per EMS. EMS states they gave Valium 5 mg IM and patient became A&O x 3. Patient notes HA in ED but denies any other sx including incontinence, CP, SOB, N/V/D, or F/C. Patient notes h/o seizures for which she takes Topamax (100 mg in am daily and 200 mg in pm daily) and Trileptal (300 mg daily). Patient notes Neuro is Dr. Tiburcio Pea and past work-ups include EEG and head CT. Patient denies any recent change in seizure medication. Patient notes Valium 5 mg x 4 daily. Patient notes she was recently taken off of narcotics by PCP. Patient notes she was recently dx with UTI on 05/19. Patient notes last seizure was at that time. Patient notes seizure medication generally works well to control seizures.   PCP is Caryn Section, MD  Neuro is Dr. Dr. Tiburcio Pea  There are no other symptoms noted at this time. Patient has no other complaints or concerns at this time.     Jessica A. Excell Seltzer, ED Scribe    Past Medical History   Diagnosis Date   ??? Arthritis    ??? Other ill-defined conditions      fibromyalgia   ??? Other ill-defined conditions      chronic ear disease    ??? Seizures           Past Surgical History   Procedure Date    ??? Hx orthopaedic      left arm    ??? Hx heent            No family history on file.     History   Social History   ??? Marital Status: Single     Spouse Name: N/A     Number of Children: N/A   ??? Years of Education: N/A   Occupational History   ??? Not on file.   Social History Main Topics   ??? Tobacco Use: Yes -- 0.5 packs/day   ??? Alcohol Use: No   ??? Drug Use:    ??? Sexually Active: Yes -- Female partner(s)   Other Topics Concern   ??? Not on file   Social History Narrative   ??? No narrative on file           ALLERGIES: Pcn, Sulfa (sulfonamide antibiotics), Ketorolac, Ciprofloxacin, Benadryl, Darvocet a500 and Aspirin      Review of Systems   Constitutional: Negative.  Negative for fever and chills.   HENT: Negative for neck pain and neck stiffness.    Eyes: Negative.    Respiratory: Negative for cough, chest tightness and shortness of breath.    Cardiovascular: Positive for chest pain. Negative for leg swelling.   Gastrointestinal: Negative.  Negative for nausea, vomiting, abdominal pain and diarrhea.   Genitourinary:  No incontinence   Musculoskeletal: Negative.    Skin: Negative.    Neurological: Positive for seizures and headaches.   Hematological: Negative.    Psychiatric/Behavioral: Negative.    All other systems reviewed and are negative.        Filed Vitals:    06/30/2008  8:36 PM 06/30/2008  8:41 PM   BP:  103/61   Pulse: 65    Temp: 98.5 ??F (36.9 ??C)    Resp: 24    Height: 5\' 4"  (1.626 m)    Weight: 140 lb (63.504 kg)    SpO2: 100%               Physical Exam     GEN: well appearing; well nourished; NAD  HEENT: NCAT, PERRLA, EOMI, clear conjunctiva  NECK: nontender, no meningismus  CAR: RRR no m/r/g  LUNGS: CTA b/l; no wheezes, rhonchi or rales  ABD: + BS, soft, NTND, no guarding or rebound  EXT: + distal pulses; normal ROM; no edema  NEURO: AAO x 3; CN 2-12 intact; 5/5 strength b/l UE and LE; sensation intact      Coding      Procedures  8:50 PM   Patient was evaluated upon arrival to her room and her husband immediately stated that he would like another physician besides myself (Carlton, L. Hinton Rao, MD).  I discussed this with the charge nurse Alda Ponder).  The patient was informed that it would some time before another physician would be able to see them, the patient was no longer post ictal and stated that they would like to wait to be seen.  Carlton, L. Hinton Rao, MD    EKG interpretation: (Preliminary)  Rhythm: sinus bradycardia; and regular . Rate (approx.): 45; Axis: normal; P wave: normal; QRS interval: normal ; ST/T wave: non-specific changes    11:16 PM  CONSULT SUMMARY  Consulting Physician: Dr. Tiburcio Pea  Medical Specialty: Neurology  Krystie Leiter A, MD has discussed patient case with Dr. Tiburcio Pea. Patient's history, disposition, and all available diagnostic and imaging results have been reviewed. Dr. Tiburcio Pea recommends increasing Trileptal to x 2 daily doses.  Jessica A. Baker, ED Scribe    11:18 PM  Spoke with pt about resting HR in 40's, likely due to valium. Hr increases when pt gets up. Pt requesting pain medication for headache. Will give patient tylenol for headache    11:39 PM  Patient has been re-evaluated before being discharged. Patient states she does NOT take Trileptal at this time, but notes she takes Tegretol x 1 daily. Shaya Altamura A, MD has spoken with patient concerning this new information. Rayfield Beem A, MD has instructed patient to increase dosage of Tegretol x 2 daily. Patient instructed to follow-up with Neurologist Dr. Clydene Laming.   Jessica A. Excell Seltzer, ED Scribe    11:43 PM  PROGRESS NOTE  Patient has been re-evaluated by Sostenes Kauffmann A, MD. Patient is feeling much better. All available results have been reviewed with patient. Care plan has been outlined and questions have been answered. Patient agrees to follow-up with Neuro. Patient leaves ED with good understanding of sx, dx, and tx. Patient is ready to go home.    Jessica A. Excell Seltzer, ED Scribe

## 2008-06-30 NOTE — ED Notes (Signed)
Pt states she had 2 seizures at home.  Pt does not appear post ictal.  Pt is A&Ox3.

## 2008-06-30 NOTE — ED Notes (Signed)
Pt in hallway threatening MDs that she is going sue them for not giving her pn medicine.  Pt yelling that they are killing her.  Pt unwilling to go back to room.  Asked pt to please stop screaming and threatening doctors.

## 2008-07-01 LAB — DRUG SCREEN, URINE
AMPHETAMINES: NEGATIVE
BARBITURATES: NEGATIVE
BENZODIAZEPINES: POSITIVE — AB
COCAINE: NEGATIVE
OPIATES: POSITIVE — AB
PCP(PHENCYCLIDINE): NEGATIVE
THC (TH-CANNABINOL): POSITIVE — AB
TRICYCLICS: NEGATIVE

## 2008-07-01 LAB — CBC WITH AUTOMATED DIFF
ABS. BASOPHILS: 0.1 10*3/uL (ref 0.0–0.1)
ABS. EOSINOPHILS: 0.2 10*3/uL (ref 0.0–0.4)
ABS. LYMPHOCYTES: 5.4 10*3/uL — ABNORMAL HIGH (ref 0.8–3.5)
ABS. MONOCYTES: 0.8 10*3/uL (ref 0.0–1.0)
ABS. NEUTROPHILS: 4 10*3/uL (ref 1.8–8.0)
BASOPHILS: 1 % (ref 0–1)
EOSINOPHILS: 2 % (ref 0–7)
HCT: 42.4 % (ref 35.0–47.0)
HGB: 14.7 g/dL (ref 11.5–16.0)
LYMPHOCYTES: 51 % — ABNORMAL HIGH (ref 12–49)
MCH: 33 PG (ref 26.0–34.0)
MCHC: 34.7 g/dL (ref 30.0–36.5)
MCV: 95.3 FL (ref 80.0–99.0)
MONOCYTES: 8 % (ref 5–13)
NEUTROPHILS: 38 % (ref 32–75)
PLATELET: 255 10*3/uL (ref 150–400)
RBC: 4.45 M/uL (ref 3.80–5.20)
RDW: 13.2 % (ref 11.5–14.5)
WBC: 10.4 10*3/uL (ref 3.6–11.0)

## 2008-07-01 LAB — METABOLIC PANEL, COMPREHENSIVE
A-G Ratio: 1.1 (ref 1.1–2.2)
ALT (SGPT): 73 U/L (ref 12–78)
AST (SGOT): 51 U/L — ABNORMAL HIGH (ref 15–37)
Albumin: 3.9 g/dL (ref 3.5–5.0)
Alk. phosphatase: 90 U/L (ref 50–136)
Anion gap: 9 mmol/L (ref 5–15)
BUN/Creatinine ratio: 10 — ABNORMAL LOW (ref 12–20)
BUN: 8 MG/DL (ref 6–20)
Bilirubin, total: 0.5 MG/DL (ref 0.2–1.0)
CO2: 24 MMOL/L (ref 21–32)
Calcium: 9.3 MG/DL (ref 8.5–10.1)
Chloride: 105 MMOL/L (ref 97–108)
Creatinine: 0.8 mg/dL (ref 0.6–1.3)
GFR est AA: >60 mL/min/{1.73_m2} (ref >60)
GFR est non-AA: >60 mL/min/{1.73_m2} (ref >60)
Globulin: 3.5 g/dL (ref 2.0–4.0)
Glucose: 70 MG/DL (ref 65–100)
Potassium: 3.5 MMOL/L (ref 3.5–5.1)
Protein, total: 7.4 g/dL (ref 6.4–8.2)
Sodium: 138 MMOL/L (ref 136–145)

## 2008-07-01 LAB — URINALYSIS W/ REFLEX CULTURE
Bacteria: NEGATIVE /HPF
Bilirubin: NEGATIVE
Blood: NEGATIVE
Glucose: NEGATIVE MG/DL
Ketone: NEGATIVE MG/DL
Leukocyte Esterase: NEGATIVE
Nitrites: NEGATIVE
Protein: NEGATIVE MG/DL
Specific gravity: 1.008 (ref 1.003–1.030)
Urobilinogen: 1 EU/DL (ref 0.2–1.0)
pH (UA): 7 (ref 5.0–8.0)

## 2008-07-01 LAB — EKG, 12 LEAD, INITIAL
Atrial Rate: 45 {beats}/min
Calculated P Axis: 64 degrees
Calculated R Axis: 60 degrees
Calculated T Axis: 72 degrees
P-R Interval: 118 ms
Q-T Interval: 496 ms
QRS Duration: 68 ms
QTC Calculation (Bezet): 429 ms
Ventricular Rate: 45 {beats}/min

## 2008-07-01 MED ORDER — ACETAMINOPHEN 500 MG TAB
500 mg | ORAL | Status: DC
Start: 2008-07-01 — End: 2008-07-01

## 2008-07-01 MED FILL — DIAZEPAM 5 MG/ML SYRINGE: 5 mg/mL | INTRAMUSCULAR | Qty: 2

## 2008-07-01 MED FILL — ACETAMINOPHEN 500 MG TAB: 500 mg | ORAL | Qty: 2

## 2008-07-02 LAB — METABOLIC PANEL, COMPREHENSIVE
A-G Ratio: 1.1 (ref 1.1–2.2)
ALT (SGPT): 53 U/L (ref 12–78)
AST (SGOT): 22 U/L (ref 15–37)
Albumin: 3.7 g/dL (ref 3.5–5.0)
Alk. phosphatase: 78 U/L (ref 50–136)
Anion gap: 5 mmol/L (ref 5–15)
BUN/Creatinine ratio: 11 — ABNORMAL LOW (ref 12–20)
BUN: 10 MG/DL (ref 6–20)
Bilirubin, total: 0.7 MG/DL (ref 0.2–1.0)
CO2: 24 MMOL/L (ref 21–32)
Calcium: 8.9 MG/DL (ref 8.5–10.1)
Chloride: 108 MMOL/L (ref 97–108)
Creatinine: 0.9 mg/dL (ref 0.6–1.3)
GFR est AA: >60 mL/min/{1.73_m2} (ref >60)
GFR est non-AA: >60 mL/min/{1.73_m2} (ref >60)
Globulin: 3.3 g/dL (ref 2.0–4.0)
Glucose: 78 MG/DL (ref 65–100)
Potassium: 3.7 MMOL/L (ref 3.5–5.1)
Protein, total: 7 g/dL (ref 6.4–8.2)
Sodium: 137 MMOL/L (ref 136–145)

## 2008-07-02 LAB — URINALYSIS W/ REFLEX CULTURE
Bilirubin: NEGATIVE
Blood: NEGATIVE
Glucose: NEGATIVE MG/DL
Ketone: NEGATIVE MG/DL
Leukocyte Esterase: NEGATIVE
Nitrites: NEGATIVE
Protein: NEGATIVE MG/DL
Specific gravity: 1.029 (ref 1.003–1.030)
Urobilinogen: 0.2 EU/DL (ref 0.2–1.0)
pH (UA): 6 (ref 5.0–8.0)

## 2008-07-02 LAB — CBC WITH AUTOMATED DIFF
ABS. BASOPHILS: 0.1 10*3/uL (ref 0.0–0.1)
ABS. EOSINOPHILS: 0.2 10*3/uL (ref 0.0–0.4)
ABS. LYMPHOCYTES: 4.5 10*3/uL — ABNORMAL HIGH (ref 0.8–3.5)
ABS. MONOCYTES: 0.8 10*3/uL (ref 0.0–1.0)
ABS. NEUTROPHILS: 5.7 10*3/uL (ref 1.8–8.0)
BASOPHILS: 1 % (ref 0–1)
EOSINOPHILS: 2 % (ref 0–7)
HCT: 41.9 % (ref 35.0–47.0)
HGB: 14.5 g/dL (ref 11.5–16.0)
LYMPHOCYTES: 40 % (ref 12–49)
MCH: 33 PG (ref 26.0–34.0)
MCHC: 34.6 g/dL (ref 30.0–36.5)
MCV: 95.4 FL (ref 80.0–99.0)
MONOCYTES: 7 % (ref 5–13)
NEUTROPHILS: 50 % (ref 32–75)
PLATELET: 228 10*3/uL (ref 150–400)
RBC: 4.39 M/uL (ref 3.80–5.20)
RDW: 13.1 % (ref 11.5–14.5)
WBC: 11.3 10*3/uL — ABNORMAL HIGH (ref 3.6–11.0)

## 2008-07-02 LAB — DRUG SCREEN, URINE
AMPHETAMINES: NEGATIVE
BARBITURATES: NEGATIVE
BENZODIAZEPINES: POSITIVE — AB
COCAINE: NEGATIVE
OPIATES: POSITIVE — AB
PCP(PHENCYCLIDINE): NEGATIVE
THC (TH-CANNABINOL): POSITIVE — AB
TRICYCLICS: NEGATIVE

## 2008-07-02 LAB — TROPONIN I: Troponin-I, Qt.: <0.04 ng/mL (ref <0.05)

## 2008-07-02 LAB — CARBAMAZEPINE: Carbamazepine: <0.5 ug/mL — ABNORMAL LOW (ref 4.0–12.0)

## 2008-07-02 MED ORDER — CARBAMAZEPINE 200 MG TAB
200 mg | ORAL | Status: AC
Start: 2008-07-02 — End: 2008-07-02
  Administered 2008-07-02: 23:00:00 via ORAL

## 2008-07-02 MED ORDER — NITROFURANTOIN (25% MACROCRYSTAL FORM) 100 MG CAP
100 mg | ORAL_CAPSULE | Freq: Two times a day (BID) | ORAL | Status: AC
Start: 2008-07-02 — End: 2008-07-09

## 2008-07-02 MED ORDER — LORAZEPAM 2 MG/ML IJ SOLN
2 mg/mL | INTRAMUSCULAR | Status: AC
Start: 2008-07-02 — End: 2008-07-02
  Administered 2008-07-02: 20:00:00 via INTRAVENOUS

## 2008-07-02 MED ORDER — LORAZEPAM 2 MG/ML IJ SOLN
2 mg/mL | Freq: Once | INTRAMUSCULAR | Status: AC
Start: 2008-07-02 — End: 2008-07-02
  Administered 2008-07-02: 21:00:00 via INTRAVENOUS

## 2008-07-02 MED ORDER — SODIUM CHLORIDE 0.9% BOLUS IV
0.9 % | INTRAVENOUS | Status: AC
Start: 2008-07-02 — End: 2008-07-02
  Administered 2008-07-02: 20:00:00 via INTRAVENOUS

## 2008-07-02 MED FILL — CARBAMAZEPINE 200 MG TAB: 200 mg | ORAL | Qty: 1

## 2008-07-02 MED FILL — SODIUM CHLORIDE 0.9 % IV: INTRAVENOUS | Qty: 1000

## 2008-07-02 MED FILL — LORAZEPAM 2 MG/ML IJ SOLN: 2 mg/mL | INTRAMUSCULAR | Qty: 1
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
## 2008-07-02 NOTE — ED Notes (Signed)
ER tech saw pt having gran mal seizure for approx. 1 minute. ERNP notified. Pt had another 15 second seizure after that and was given Ativan. Pt now in postictal state, but beginning to speak.

## 2008-07-02 NOTE — ED Notes (Signed)
Patient reports having plenty of her medications at home including her Tegretol

## 2008-07-02 NOTE — ED Provider Notes (Addendum)
Patient is a 33 y.o. female presenting with seizures. The history is provided by the patient. No language interpreter was used.   Seizure   This is a recurrent problem. The current episode started 1 to 2 hours ago. The problem has been rapidly improving. There was 1 seizure. Duration: unknown 63 year old son called EMS. Associated symptoms include headaches. Pertinent negatives include no confusion, no speech difficulty, no visual disturbance, no neck stiffness, no sore throat, no chest pain, no cough, no vomiting, no diarrhea and no muscle weakness. The episode was witnessed (by 12 year old son). There was return to baseline postseizure. The seizures did not continue in the ED. The seizure(s) had no focality. Possible causes include med or dosage change. patient on multiple medications that cause sedation  She reports headaches.  She reports no chest pain, no confusion, no visual disturbance, no diarrhea, no vomiting, no sore throat, no muscle weakness, no stiff neck, no speech difficulty and no cough. There were no medications administered prior to arrival. Home seizure medications include: Tegretol.       Past Medical History   Diagnosis Date   ??? Arthritis    ??? Seizures    ??? Other ill-defined conditions      fibromyalgia   ??? Other ill-defined conditions      chronic ear disease    ??? Other ill-defined conditions      toxic mildew syndrome          Past Surgical History   Procedure Date   ??? Hx orthopaedic      left arm    ??? Hx heent            No family history on file.     History   Social History   ??? Marital Status: Single     Spouse Name: N/A     Number of Children: N/A   ??? Years of Education: N/A   Occupational History   ??? Not on file.   Social History Main Topics   ??? Tobacco Use: Yes -- 0.5 packs/day   ??? Alcohol Use: No   ??? Drug Use: No   ??? Sexually Active: Yes -- Female partner(s)   Other Topics Concern   ??? Not on file   Social History Narrative   ??? No narrative on file            ALLERGIES: Pcn, Sulfa (sulfonamide antibiotics), Ketorolac, Ciprofloxacin, Benadryl, Darvocet a500 and Aspirin      Review of Systems   HENT: Negative for sore throat.    Eyes: Negative for visual disturbance.   Respiratory: Negative for cough.    Cardiovascular: Negative for chest pain.   Gastrointestinal: Negative for vomiting and diarrhea.   Neurological: Positive for loss of consciousness and headaches. Negative for speech difficulty.   Psychiatric/Behavioral: Negative for confusion.       Filed Vitals:    07/02/2008  2:56 PM   BP: 88/39   Pulse: 56   Temp: 98.2 ??F (36.8 ??C)   Resp: 12   Height: 5' 4.5" (1.638 m)   Weight: 140 lb (63.504 kg)   SpO2: 100%              Physical Exam   Nursing note and vitals reviewed.  Constitutional: She is oriented to person, place, and time. She appears well-developed and well-nourished. No distress.   HENT:   Head: Normocephalic and atraumatic.   Right Ear: External ear normal.   Left Ear:  External ear normal.   Mouth/Throat: Oropharynx is clear and moist. No oropharyngeal exudate.   Eyes: Conjunctivae and extraocular motions are normal. Pupils are equal, round, and reactive to light. Right eye exhibits no discharge. Left eye exhibits no discharge. No scleral icterus.   Neck: Normal range of motion. No tracheal deviation present. No thyromegaly present.   Cardiovascular: Regular rhythm and normal heart sounds.    No murmur heard.       Huston Foley 40-50's patient says she is always this way.  She also reports always having a low blood pressure.   Pulmonary/Chest: Effort normal and breath sounds normal. No respiratory distress. She has no wheezes. She has no rales. She exhibits no tenderness.   Abdominal: Soft. Bowel sounds are normal. She exhibits no distension. No tenderness. She has no rebound and no guarding.   Musculoskeletal: Normal range of motion. She exhibits no edema and no tenderness.   Lymphadenopathy:     She has no cervical adenopathy.    Neurological: She is alert and oriented to person, place, and time. No cranial nerve deficit. Coordination normal.        Patient has had 2 small seizures with mild shaking before given Ativan.  5 minutes before seizures she was up on the phone talking to significant other.     Skin: Skin is warm. No erythema.   Psychiatric: She has a normal mood and affect. Her behavior is normal. Judgment and thought content normal.        Patient has not had any further seizure after Ativan 2mg  IV given    Coding      Procedures    EKG interpretation: (Preliminary)  Rhythm: sinus bradycardia; and regular . Rate (approx.): 42; Axis: normal; P wave: normal; QRS interval: prolonged; T wave: abnormality;Other findings: abnormal ekg.

## 2008-07-02 NOTE — ED Notes (Signed)
Pt had another 10 second granmal seizure. ERNP notified and pt given another mg of Ativan. HOB at 45 degrees, airway maintained.

## 2008-07-02 NOTE — ED Notes (Signed)
Side rails padded for seizure precautions

## 2008-07-02 NOTE — ED Provider Notes (Signed)
I have personally seen and evaluated patient. I find the patient's history and physical exam are consistent with the PA's NP documentation. I agree with the care provided, treatments rendered, disposition and follow up plan.  I have personally seen and evaluated patient. I find the patient's history and physical exam are consistent with the PA's NP documentation. I agree with the care provided, treatments rendered, disposition and follow up plan.

## 2008-07-02 NOTE — ED Notes (Signed)
Pt states that her blood pressure and pulse are "always low"

## 2008-07-02 NOTE — ED Notes (Signed)
I have reviewed discharge instructions with the patient.  The patient verbalized understanding. Respirations unlabored. Skin warm and dry. Pt accompanied home by finance. Given 1 prescription and work note.
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
## 2008-07-02 NOTE — ED Notes (Signed)
Pt's finance at bedside.

## 2008-07-03 LAB — EKG, 12 LEAD, INITIAL
Atrial Rate: 42 {beats}/min
Calculated P Axis: 25 degrees
Calculated R Axis: 45 degrees
Calculated T Axis: 41 degrees
P-R Interval: 120 ms
Q-T Interval: 568 ms
QRS Duration: 76 ms
QTC Calculation (Bezet): 474 ms
Ventricular Rate: 42 {beats}/min

## 2008-07-03 MED ORDER — DIAZEPAM 5 MG/ML SYRINGE
5 mg/mL | INTRAMUSCULAR | Status: AC
Start: 2008-07-03 — End: 2008-07-03

## 2008-07-03 MED ORDER — SODIUM CHLORIDE 0.9% BOLUS IV
0.9 % | INTRAVENOUS | Status: AC
Start: 2008-07-03 — End: 2008-07-03
  Administered 2008-07-03: 15:00:00 via INTRAVENOUS

## 2008-07-03 MED ORDER — ACETAMINOPHEN 500 MG TAB
500 mg | ORAL | Status: DC
Start: 2008-07-03 — End: 2008-07-03

## 2008-07-03 MED ORDER — SODIUM CHLORIDE 0.9 % IV
5005100 mg/5 mL (100 mg/mL) | INTRAVENOUS | Status: DC
Start: 2008-07-03 — End: 2008-07-03

## 2008-07-03 MED ORDER — PROCHLORPERAZINE EDISYLATE 5 MG/ML INJECTION
5 mg/mL | INTRAMUSCULAR | Status: AC
Start: 2008-07-03 — End: 2008-07-03
  Administered 2008-07-03: 15:00:00 via INTRAVENOUS

## 2008-07-03 MED ORDER — SODIUM CHLORIDE 0.9 % IJ SYRG
INTRAMUSCULAR | Status: DC
Start: 2008-07-03 — End: 2008-07-03

## 2008-07-03 MED ORDER — DIAZEPAM 5 MG/ML SYRINGE
5 mg/mL | INTRAMUSCULAR | Status: AC
Start: 2008-07-03 — End: 2008-07-03
  Administered 2008-07-03: 15:00:00 via INTRAVENOUS

## 2008-07-03 MED FILL — SALINE FLUSH INJECTION SYRINGE: INTRAMUSCULAR | Qty: 10

## 2008-07-03 MED FILL — VALPROATE SODIUM 100 MG/ML IV: 500 mg/5 mL (100 mg/mL) | INTRAVENOUS | Qty: 5

## 2008-07-03 MED FILL — PROCHLORPERAZINE EDISYLATE 5 MG/ML INJECTION: 5 mg/mL | INTRAMUSCULAR | Qty: 2

## 2008-07-03 MED FILL — DIAZEPAM 5 MG/ML SYRINGE: 5 mg/mL | INTRAMUSCULAR | Qty: 2

## 2008-07-03 MED FILL — SODIUM CHLORIDE 0.9 % IV: INTRAVENOUS | Qty: 1000

## 2008-07-03 NOTE — ED Notes (Signed)
Nurse talked with patient about plan of care regarding Depakote injection that Dr. Maricela Curet states she is going to order.  Patient then stated that she was told not to ever take Depakote while on her other seizure medications.  Dr. Maricela Curet made aware.

## 2008-07-03 NOTE — ED Notes (Signed)
Advised patient that Depakote is ordered and will she allow [place] to give it to her now.  Patient very drowsy and had to be woken up mid-sentence and states that she was told by a physician in [place] that she cannot take the Depakote with her Tegretol.

## 2008-07-03 NOTE — ED Notes (Signed)
Patient arrived via EMS with complaints of seizure like activity today.  Patient with history of seizures and states she is taking her medication as prescribed.  Per EMS patient having seizure activity while they were starting an IV and states patient able to keep arm still while still having seizure like activity.  Patient seen at [facility] yesterday for same.  Patient states seen here 06/30/08 for same symptoms as well.  Patient with positive drug screen but denies any drug use.  Dr. Maricela Curet made aware.

## 2008-07-03 NOTE — ED Notes (Signed)
Patient refusing to take tylenol which Dr. Maricela Curet ordered for her pain.  Patient states she cannot mix the tylenol with her other medications due to her liver function.  Dr. Maricela Curet made aware.

## 2008-07-03 NOTE — ED Notes (Signed)
Nurse called into room for patient having seizure.  Dr.  Maricela Curet gave verbal order for 2.5mg  valium IV.  Medication given to patient at 1108.

## 2008-07-03 NOTE — ED Notes (Signed)
Patient requesting additional pain medication and stated that the compazine did not work for her headache.  Nurse spoke with Dr. Maricela Curet about patient's request.  Nurse awaiting new orders.

## 2008-07-03 NOTE — ED Notes (Signed)
Pt has not had any more seizures here. No postictal state .will dc and have patient call neurology office per dr Lowella Dell request. Neg head ct in 5/10

## 2008-07-03 NOTE — ED Provider Notes (Addendum)
The history is provided by the patient.    33 y.o. female presents via EMS to ER with c/o seizure. Per son, pt had a seizure 30-40 minutes PTA and pt appeared confused after seizure episode. Pt notes that she bit her tongue during seizure episode. Pt denies fevers or any cold sxs. Pt takes Tegretol. Pt takes Topomax, 100mg  in the morning and 200mg  at night. Pt missed today's dosage of valium. Per family patient has increased seizure activity when she has a headache and has had a HA since Friday.    PCP- Caryn Section, MD    There are no further complaints or sx's at this time.        Past Medical History   Diagnosis Date   ??? Arthritis    ??? Seizures    ??? Other ill-defined conditions      fibromyalgia   ??? Other ill-defined conditions      chronic ear disease    ??? Other ill-defined conditions      toxic mildew syndrome          Past Surgical History   Procedure Date   ??? Hx orthopaedic      left arm    ??? Hx heent            No family history on file.     History   Social History   ??? Marital Status: Single     Spouse Name: N/A     Number of Children: N/A   ??? Years of Education: N/A   Occupational History   ??? Not on file.   Social History Main Topics   ??? Tobacco Use: Yes -- 0.5 packs/day   ??? Alcohol Use: No   ??? Drug Use: No   ??? Sexually Active: Yes -- Female partner(s)   Other Topics Concern   ??? Not on file   Social History Narrative   ??? No narrative on file           ALLERGIES: Pcn, Sulfa (sulfonamide antibiotics), Ketorolac, Ciprofloxacin, Benadryl, Darvocet a500 and Aspirin      Review of Systems   Constitutional: Negative.  Negative for fever.   HENT: Negative.  Negative for congestion, rhinorrhea and postnasal drip.    Neurological: Positive for seizures.   All other systems reviewed and are negative.        Filed Vitals:    07/03/2008 10:54 AM   BP: 100/60   Pulse: 50   Temp: 97.9 ??F (36.6 ??C)   Resp: 16   SpO2: 98%              Physical Exam   GEN: well appearing; well nourished; NAD   HEENT: NCAT, PERRLA 4 mm, EOMI, clear conjunctiva  CAR: bradycardic no m/r/g  LUNGS: CTA b/l; no wheezes, rhonchi or rales  ABD: + BS, soft, NTND, no guarding or rebound  EXT: + distal pulses; normal ROM; no edema  SKIN: no lesions  NEURO: AAO x 3; CN 2-12 intact; 5/5 strength b/l UE and LE; sensation intact        Coding      Procedures    11:16 AM  CONSULT NOTE:  Spoke with Dr. Susette Racer,  Specialty: Neurology  Discussed pt's hx, disposition, and available diagnostic and imaging results. Reviewed care plans. Consulting physician agrees with plans as outlined. He/She would like pt to f/u.  Lendon Collar Madaline Guthrie, ED Scribe    11:17 AM  Dr. Maricela Curet reviewed the ED chart  from pt's previous 2 ED visits. Dr. Maricela Curet also reviewed lab work from those visits. Dr. Maricela Curet discussed with EMS that pt's o2 stats never dropped during her seizure and pt could follow commands. Therefore EMS did not medicate pt.  Lendon Collar Madaline Guthrie, ED Scribe    PROGRESS NOTE:  2:33 PM  Dr. Maricela Curet spoke with Neita Carp about sx, dx, tx, and rx with good understanding. Care plan outlined and precautions discussed. Pt has been re-examined. Pt is feeling better. There are no new complaints, changes, or physical finding. Reviewed results of lab work, radiology, and EKG with pt.  Medications given in ED and prescription medications were discussed with pt. All pt???s questions and concerns were addressed.  Pt was instructed and agrees to f/u with PCP and Neurology, as well as to return to ED upon further deterioration. Pt is ready to go home.   Lendon Collar Madaline Guthrie, ED Scribe

## 2008-07-03 NOTE — ED Notes (Signed)
I have reviewed discharge instructions with the patient.  The patient verbalized understanding.  Signature pad unavailable at this time.

## 2008-07-04 LAB — CULTURE, URINE
Colonies Counted: 25000
Colony Count: 25000

## 2008-10-21 LAB — METABOLIC PANEL, COMPREHENSIVE
A-G Ratio: 0.9 — ABNORMAL LOW (ref 1.1–2.2)
ALT (SGPT): 39 U/L (ref 12–78)
AST (SGOT): 15 U/L (ref 15–37)
Albumin: 3.7 g/dL (ref 3.5–5.0)
Alk. phosphatase: 86 U/L (ref 50–136)
Anion gap: 14 mmol/L (ref 5–15)
BUN/Creatinine ratio: 30 — ABNORMAL HIGH (ref 12–20)
BUN: 21 MG/DL — ABNORMAL HIGH (ref 6–20)
Bilirubin, total: 0.4 MG/DL (ref 0.2–1.0)
CO2: 20 MMOL/L — ABNORMAL LOW (ref 21–32)
Calcium: 9.6 MG/DL (ref 8.5–10.1)
Chloride: 104 MMOL/L (ref 97–108)
Creatinine: 0.7 MG/DL (ref 0.6–1.3)
GFR est AA: >60 mL/min/{1.73_m2} (ref >60)
GFR est non-AA: >60 mL/min/{1.73_m2} (ref >60)
Globulin: 4.1 g/dL — ABNORMAL HIGH (ref 2.0–4.0)
Glucose: 101 MG/DL — ABNORMAL HIGH (ref 65–100)
Potassium: 3.5 MMOL/L (ref 3.5–5.1)
Protein, total: 7.8 g/dL (ref 6.4–8.2)
Sodium: 138 MMOL/L (ref 136–145)

## 2008-10-21 LAB — URINALYSIS W/MICROSCOPIC
Bilirubin: NEGATIVE
Blood: NEGATIVE
Glucose: NEGATIVE MG/DL
Ketone: NEGATIVE MG/DL
Nitrites: NEGATIVE
Protein: NEGATIVE MG/DL
Specific gravity: 1.026 (ref 1.003–1.030)
Urobilinogen: 0.2 EU/DL (ref 0.2–1.0)
pH (UA): 6 (ref 5.0–8.0)

## 2008-10-21 LAB — CBC WITH AUTOMATED DIFF
ABS. BASOPHILS: 0.1 10*3/uL (ref 0.0–0.1)
ABS. EOSINOPHILS: 0.3 10*3/uL (ref 0.0–0.4)
ABS. LYMPHOCYTES: 5.1 10*3/uL — ABNORMAL HIGH (ref 0.8–3.5)
ABS. MONOCYTES: 0.8 10*3/uL (ref 0.0–1.0)
ABS. NEUTROPHILS: 7.2 10*3/uL (ref 1.8–8.0)
BASOPHILS: 1 % (ref 0–1)
EOSINOPHILS: 2 % (ref 0–7)
HCT: 45.4 % (ref 35.0–47.0)
HGB: 15.7 g/dL (ref 11.5–16.0)
LYMPHOCYTES: 38 % (ref 12–49)
MCH: 33.2 PG (ref 26.0–34.0)
MCHC: 34.6 g/dL (ref 30.0–36.5)
MCV: 96 FL (ref 80.0–99.0)
MONOCYTES: 6 % (ref 5–13)
NEUTROPHILS: 53 % (ref 32–75)
PLATELET: 283 10*3/uL (ref 150–400)
RBC: 4.73 M/uL (ref 3.80–5.20)
RDW: 14.1 % (ref 11.5–14.5)
WBC: 13.5 10*3/uL — ABNORMAL HIGH (ref 3.6–11.0)

## 2008-10-21 LAB — GLUCOSE, POC: Glucose (POC): 104 mg/dL (ref 65–105)

## 2008-10-21 LAB — CARBAMAZEPINE: Carbamazepine: <0.5 ug/mL — ABNORMAL LOW (ref 4.0–12.0)

## 2008-10-21 MED ORDER — NITROFURANTOIN (25% MACROCRYSTAL FORM) 100 MG CAP
100 mg | ORAL | Status: AC
Start: 2008-10-21 — End: 2008-10-21
  Administered 2008-10-21: 22:00:00 via ORAL

## 2008-10-21 MED ORDER — LORAZEPAM 2 MG/ML IJ SOLN
2 mg/mL | INTRAMUSCULAR | Status: AC
Start: 2008-10-21 — End: 2008-10-21

## 2008-10-21 MED ORDER — MORPHINE 10 MG/ML INJ SOLUTION
10 mg/ml | INTRAMUSCULAR | Status: AC
Start: 2008-10-21 — End: 2008-10-21
  Administered 2008-10-21: via INTRAVENOUS

## 2008-10-21 MED ORDER — MORPHINE 10 MG/ML INJ SOLUTION
10 mg/ml | INTRAMUSCULAR | Status: AC
Start: 2008-10-21 — End: 2008-10-21
  Administered 2008-10-21: 21:00:00 via INTRAVENOUS

## 2008-10-21 MED ORDER — LORAZEPAM 2 MG/ML IJ SOLN
2 mg/mL | INTRAMUSCULAR | Status: AC
Start: 2008-10-21 — End: 2008-10-21
  Administered 2008-10-21: 19:00:00

## 2008-10-21 MED ORDER — DROPERIDOL 2.5 MG/ML IJ SOLN
2.5 mg/mL | Freq: Once | INTRAMUSCULAR | Status: AC
Start: 2008-10-21 — End: 2008-10-21
  Administered 2008-10-21: 22:00:00 via INTRAVENOUS

## 2008-10-21 MED ORDER — SODIUM CHLORIDE 0.9 % IJ SYRG
INTRAMUSCULAR | Status: AC
Start: 2008-10-21 — End: 2008-10-21
  Administered 2008-10-21: 19:00:00

## 2008-10-21 MED ORDER — CARBAMAZEPINE 200 MG TAB
200 mg | Freq: Once | ORAL | Status: AC
Start: 2008-10-21 — End: 2008-10-21
  Administered 2008-10-21: via ORAL

## 2008-10-21 MED ORDER — SODIUM CHLORIDE 0.9 % IV
Freq: Once | INTRAVENOUS | Status: DC
Start: 2008-10-21 — End: 2008-10-21

## 2008-10-21 MED ORDER — SODIUM CHLORIDE 0.9 % IJ SYRG
INTRAMUSCULAR | Status: DC
Start: 2008-10-21 — End: 2008-10-21

## 2008-10-21 MED ORDER — MORPHINE 10 MG/ML INJ SOLUTION
10 mg/ml | INTRAMUSCULAR | Status: AC
Start: 2008-10-21 — End: 2008-10-21
  Administered 2008-10-21: 22:00:00 via INTRAVENOUS

## 2008-10-21 MED ORDER — METOCLOPRAMIDE 5 MG/ML IJ SOLN
5 mg/mL | INTRAMUSCULAR | Status: AC
Start: 2008-10-21 — End: 2008-10-21
  Administered 2008-10-21: 22:00:00 via INTRAVENOUS

## 2008-10-21 MED ORDER — SODIUM CHLORIDE 0.9 % IV
Freq: Once | INTRAVENOUS | Status: AC
Start: 2008-10-21 — End: 2008-10-21
  Administered 2008-10-21: 20:00:00 via INTRAVENOUS

## 2008-10-21 MED FILL — DROPERIDOL 2.5 MG/ML IJ SOLN: 2.5 mg/mL | INTRAMUSCULAR | Qty: 2

## 2008-10-21 MED FILL — SALINE FLUSH INJECTION SYRINGE: INTRAMUSCULAR | Qty: 10

## 2008-10-21 MED FILL — MACROBID 100 MG CAPSULE: 100 mg | ORAL | Qty: 1

## 2008-10-21 MED FILL — MORPHINE 10 MG/ML SYRINGE: 10 mg/mL | INTRAMUSCULAR | Qty: 1

## 2008-10-21 MED FILL — CARBAMAZEPINE 200 MG TAB: 200 mg | ORAL | Qty: 2

## 2008-10-21 MED FILL — LORAZEPAM 2 MG/ML IJ SOLN: 2 mg/mL | INTRAMUSCULAR | Qty: 1

## 2008-10-21 MED FILL — METOCLOPRAMIDE 5 MG/ML IJ SOLN: 5 mg/mL | INTRAMUSCULAR | Qty: 2

## 2008-10-21 MED FILL — SALINE FLUSH INJECTION SYRINGE: INTRAMUSCULAR | Qty: 20

## 2008-10-21 MED FILL — SODIUM CHLORIDE 0.9 % IV: INTRAVENOUS | Qty: 1000

## 2008-10-21 NOTE — ED Notes (Signed)
Patient having seizure-like activity again. Dr Joelene Millin notified

## 2008-10-21 NOTE — ED Notes (Signed)
Husband called for nurse - patient seizing again

## 2008-10-21 NOTE — ED Notes (Signed)
Patient assisted to bedside commode to provide urine sample

## 2008-10-21 NOTE — ED Provider Notes (Signed)
HPI Comments: 33 y.o. WF with hx of seizures presents via EMS to ED with C/O multiple seizures. Per EMS pt had x2 seizures prior to their arrival. EMS reports pt's friends stated seizures lasted 2-3 minutes. EMS reports that pt had another 2-3 seizures during transport, each lasting 2-3 minutes. EMS reports they established IV access but gave no medications to the pt.  Pt states she woke up today with a headache and neck pain, she took x1 10mg  percocet. Pt states she then felt funny, reports she had a "aura with a metallic taste in her mouth." Pt reports this usually precedes here seizures. Pt reports calling a friend to come over and the last thing she remembers was lying on the couch, states she woke up on the floor with her friends over her. Pt reports her last prior seizure was in June. Pt reports she still feels neck pain and headaches at time of exam.     Social:  reports that she has been using tobacco.  She reports that she does not currently drink alcohol or use illicit drugs.   Allergies: is allergic to pcn, sulfa (sulfonamide antibiotics), ketorolac, ciprofloxacin, benadryl, darvocet a500, and aspirin.   PMH: Seizures; Fibromyalgia;  PCP: Caryn Section, MD     Spoke with Pt's husband who came later and he stated that the pt has "seizures" when the pt's headaches are not controlled.  Pt agreed, and stated that this has occurred multiple times in her past.    There are no other sxs noted at this time. The pt has no further complaints.   Documented by Benjiman Core, ED Scribe, at 3:55 PM as dictated by Darlys Gales, MD.     Patient is a 33 y.o. female presenting with seizures. The history is provided by the patient, the EMS personnel and a friend. No language interpreter was used.   Seizure   Associated symptoms include headaches.   She reports headaches.        Past Medical History   Diagnosis Date   ??? Arthritis    ??? Seizures    ??? Other ill-defined conditions      fibromyalgia    ??? Other ill-defined conditions      chronic ear disease    ??? Other ill-defined conditions      toxic mildew syndrome          Past Surgical History   Procedure Date   ??? Hx orthopaedic      left arm    ??? Hx heent            No family history on file.     History   Social History   ??? Marital Status: Single     Spouse Name: N/A     Number of Children: N/A   ??? Years of Education: N/A   Occupational History   ??? Not on file.   Social History Main Topics   ??? Tobacco Use: Yes -- 0.5 packs/day   ??? Alcohol Use: No   ??? Drug Use: No   ??? Sexually Active: Yes -- Female partner(s)   Other Topics Concern   ??? Not on file   Social History Narrative   ??? No narrative on file           ALLERGIES: Pcn, Sulfa (sulfonamide antibiotics), Ketorolac, Ciprofloxacin, Benadryl, Darvocet a500 and Aspirin      Review of Systems   Constitutional: Negative.    HENT: Positive for neck pain.  Metallic taste in mouth    Eyes: Negative.    Respiratory: Negative.    Cardiovascular: Negative.    Gastrointestinal: Negative.    Genitourinary: Negative.    Skin: Negative.    Neurological: Positive for headaches.   All other systems reviewed and are negative.        Filed Vitals:    10/21/2008  7:15 PM 10/21/2008  7:30 PM 10/21/2008  7:46 PM 10/21/2008  8:00 PM   BP: 92/59 105/66 89/47 87/54    Pulse: 62 62 61 62   Resp: 13 16 13 16    Height:       Weight:       SpO2:                  Physical Exam   Vitals reviewed.  Constitutional: She is oriented to person, place, and time. She appears well-developed and well-nourished.   HENT:   Head: Normocephalic and atraumatic.   Eyes: Pupils are equal, round, and reactive to light.   Neck: No JVD present. Muscular tenderness present. No tracheal deviation present. No thyromegaly present.        Spasms bilaterally in trapezius muscles     Cardiovascular: Normal rate, regular rhythm and normal heart sounds.  Exam reveals no gallop and no friction rub.    No murmur heard.   Pulmonary/Chest: Effort normal and breath sounds normal. No stridor. No respiratory distress. She has no wheezes. She has no rales. She exhibits no tenderness.   Abdominal: Soft. She exhibits no distension and no mass. No tenderness. She has no rebound and no guarding.   Musculoskeletal: She exhibits no edema and no tenderness.   Lymphadenopathy:     She has no cervical adenopathy.   Neurological: She is alert and oriented to person, place, and time.        During exam pt began actively convulsing for 1.5 minutes. Gave 2 mg Ativan.    Skin: Skin is warm and dry. No rash noted. No erythema. No pallor.   Psychiatric: She has a normal mood and affect. Her behavior is normal. Thought content normal.        Coding    Procedures    EKG interpretation: (Preliminary)  Rhythm: normal sinus rhythm; and regular . Rate (approx.): 65; Axis: normal; P wave: normal; QRS interval: normal ; ST/T wave: normal; in  Lead: throughout all leads; Other findings: nsr.      DISCHARGE NOTE:  7:49 PM  Manroop Jakubowicz A, MD spoke with Neita Carp about sx, dx, tx, and rx with good understanding. Care plan outlined and precautions discussed. Pt has been re-examined. Pt is feeling better. There are no new complaints, changes, or physical findings. Reviewed results of lab work with pt. Discussed results of carbamazepine test with pt. Medications given in ED and prescription medications were discussed with pt. All pt???s questions and concerns were addressed.  Pt was instructed and agrees to f/u with Neurology, as well as to return to ED upon further deterioration. Pt is ready to go home.   Benjiman Core, ED Scribe, as dictated by Darlys Gales, MD

## 2008-10-21 NOTE — ED Notes (Signed)
Patient reports metal taste back, less than 2 minutes later patient seized, RN at bedside, full body shaking, lasting 60 seconds. Seizure precautions in place prior to episode

## 2008-10-21 NOTE — ED Notes (Signed)
2mg  IV push Ativan was given

## 2008-10-21 NOTE — ED Notes (Signed)
Dr Joelene Millin at bedside evaluating patient. Patient seizing. Reports metallic taste in mouth just prior to seizure. Patient was monitored through seizure by Dr Joelene Millin and Apolinar Junes, Tech to prevent any injury.

## 2008-10-21 NOTE — ED Notes (Signed)
Discontinued IV placed by EMS PTA. Patient verbalized understanding of discharge instructions and follow up tomorrow with neurology

## 2008-10-22 LAB — EKG, 12 LEAD, INITIAL
Atrial Rate: 65 {beats}/min
Calculated P Axis: 49 degrees
Calculated R Axis: 77 degrees
Calculated T Axis: 57 degrees
Diagnosis: NORMAL
P-R Interval: 114 ms
Q-T Interval: 402 ms
QRS Duration: 70 ms
QTC Calculation (Bezet): 418 ms
Ventricular Rate: 65 {beats}/min

## 2008-10-22 MED FILL — DIAZEPAM 5 MG/ML SYRINGE: 5 mg/mL | INTRAMUSCULAR | Qty: 2

## 2008-11-24 LAB — URINALYSIS W/ REFLEX CULTURE
Bacteria: NEGATIVE /HPF
Bilirubin: NEGATIVE
Blood: NEGATIVE
Glucose: NEGATIVE MG/DL
Ketone: NEGATIVE MG/DL
Leukocyte Esterase: NEGATIVE
Nitrites: NEGATIVE
Protein: NEGATIVE MG/DL
Specific gravity: 1.012 (ref 1.003–1.030)
Urobilinogen: 0.2 EU/DL (ref 0.2–1.0)
pH (UA): 6 (ref 5.0–8.0)

## 2008-11-24 LAB — CBC WITH AUTOMATED DIFF
ABS. BASOPHILS: 0 10*3/uL (ref 0.0–0.1)
ABS. EOSINOPHILS: 0.7 10*3/uL — ABNORMAL HIGH (ref 0.0–0.4)
ABS. LYMPHOCYTES: 6 10*3/uL — ABNORMAL HIGH (ref 0.8–3.5)
ABS. MONOCYTES: 0.4 10*3/uL (ref 0.0–1.0)
ABS. NEUTROPHILS: 4.8 10*3/uL (ref 1.8–8.0)
BASOPHILS: 0 % (ref 0–1)
EOSINOPHILS: 6 % (ref 0–7)
HCT: 39.3 % (ref 35.0–47.0)
HGB: 13.3 g/dL (ref 11.5–16.0)
LYMPHOCYTES: 51 % — ABNORMAL HIGH (ref 12–49)
MCH: 32.8 PG (ref 26.0–34.0)
MCHC: 33.8 g/dL (ref 30.0–36.5)
MCV: 97 FL (ref 80.0–99.0)
MONOCYTES: 3 % — ABNORMAL LOW (ref 5–13)
NEUTROPHILS: 40 % (ref 32–75)
PLATELET: 223 10*3/uL (ref 150–400)
RBC: 4.05 M/uL (ref 3.80–5.20)
RDW: 13.4 % (ref 11.5–14.5)
WBC COMMENTS: REACTIVE
WBC: 11.9 10*3/uL — ABNORMAL HIGH (ref 3.6–11.0)

## 2008-11-24 LAB — METABOLIC PANEL, COMPREHENSIVE
A-G Ratio: 1 — ABNORMAL LOW (ref 1.1–2.2)
ALT (SGPT): 23 U/L (ref 12–78)
AST (SGOT): 9 U/L — ABNORMAL LOW (ref 15–37)
Albumin: 3.1 g/dL — ABNORMAL LOW (ref 3.5–5.0)
Alk. phosphatase: 62 U/L (ref 50–136)
Anion gap: 9 mmol/L (ref 5–15)
BUN/Creatinine ratio: 18 (ref 12–20)
BUN: 14 MG/DL (ref 6–20)
Bilirubin, total: 0.4 MG/DL (ref 0.2–1.0)
CO2: 24 MMOL/L (ref 21–32)
Calcium: 8 MG/DL — ABNORMAL LOW (ref 8.5–10.1)
Chloride: 107 MMOL/L (ref 97–108)
Creatinine: 0.8 MG/DL (ref 0.6–1.3)
GFR est AA: >60 mL/min/{1.73_m2} (ref >60)
GFR est non-AA: >60 mL/min/{1.73_m2} (ref >60)
Globulin: 3.1 g/dL (ref 2.0–4.0)
Glucose: 74 MG/DL (ref 65–100)
Potassium: 3.7 MMOL/L (ref 3.5–5.1)
Protein, total: 6.2 g/dL — ABNORMAL LOW (ref 6.4–8.2)
Sodium: 140 MMOL/L (ref 136–145)

## 2008-11-24 LAB — GLUCOSE, POC
Glucose (POC): 91 mg/dL (ref 65–105)
Glucose (POC): 75 mg/dL (ref 65–105)

## 2008-11-24 MED ORDER — SODIUM CHLORIDE 0.9% BOLUS IV
0.9 % | INTRAVENOUS | Status: AC
Start: 2008-11-24 — End: 2008-11-24
  Administered 2008-11-24: 18:00:00 via INTRAVENOUS

## 2008-11-24 MED ORDER — DROPERIDOL 2.5 MG/ML IJ SOLN
2.5 mg/mL | INTRAMUSCULAR | Status: AC
Start: 2008-11-24 — End: 2008-11-24
  Administered 2008-11-24: 19:00:00 via INTRAVENOUS

## 2008-11-24 MED ORDER — OXCARBAZEPINE 300 MG TAB
300 mg | ORAL | Status: AC
Start: 2008-11-24 — End: 2008-11-24
  Administered 2008-11-24: 19:00:00 via ORAL

## 2008-11-24 MED ORDER — LORAZEPAM 2 MG/ML IJ SOLN
2 mg/mL | INTRAMUSCULAR | Status: AC
Start: 2008-11-24 — End: 2008-11-24
  Administered 2008-11-24: 17:00:00 via INTRAVENOUS

## 2008-11-24 MED ORDER — TOPIRAMATE 100 MG TAB
100 mg | ORAL | Status: AC
Start: 2008-11-24 — End: 2008-11-24
  Administered 2008-11-24: 18:00:00 via ORAL

## 2008-11-24 MED ORDER — PROCHLORPERAZINE EDISYLATE 5 MG/ML INJECTION
5 mg/mL | Freq: Four times a day (QID) | INTRAMUSCULAR | Status: DC | PRN
Start: 2008-11-24 — End: 2008-11-24

## 2008-11-24 MED ORDER — IBUPROFEN 400 MG TAB
400 mg | ORAL | Status: AC
Start: 2008-11-24 — End: 2008-11-24
  Administered 2008-11-24: 21:00:00 via ORAL

## 2008-11-24 MED ORDER — OXYCODONE-ACETAMINOPHEN 5 MG-325 MG TAB
5-325 mg | ORAL | Status: AC
Start: 2008-11-24 — End: 2008-11-24
  Administered 2008-11-24: 21:00:00 via ORAL

## 2008-11-24 MED ORDER — MORPHINE 2 MG/ML INJECTION
2 mg/mL | INTRAMUSCULAR | Status: DC
Start: 2008-11-24 — End: 2008-11-24

## 2008-11-24 MED FILL — OXYCODONE-ACETAMINOPHEN 5 MG-325 MG TAB: 5-325 mg | ORAL | Qty: 2

## 2008-11-24 MED FILL — IBUPROFEN 400 MG TAB: 400 mg | ORAL | Qty: 2

## 2008-11-24 MED FILL — OXCARBAZEPINE 300 MG TAB: 300 mg | ORAL | Qty: 1

## 2008-11-24 MED FILL — DROPERIDOL 2.5 MG/ML IJ SOLN: 2.5 mg/mL | INTRAMUSCULAR | Qty: 2

## 2008-11-24 MED FILL — SODIUM CHLORIDE 0.9 % IV: INTRAVENOUS | Qty: 1000

## 2008-11-24 MED FILL — MORPHINE 2 MG/ML INJECTION: 2 mg/mL | INTRAMUSCULAR | Qty: 1

## 2008-11-24 MED FILL — LORAZEPAM 2 MG/ML IJ SOLN: 2 mg/mL | INTRAMUSCULAR | Qty: 1

## 2008-11-24 MED FILL — TOPIRAMATE 100 MG TAB: 100 mg | ORAL | Qty: 2

## 2008-11-24 NOTE — ED Notes (Signed)
Report given to [facility] in CDU.  Pt stable and ready for transport.

## 2008-11-24 NOTE — ED Notes (Signed)
Pt very sleepy will wake with stimulation on mon x 3  MD aware . Holding Morphine.  Son at bedside says his step father is on the way.

## 2008-11-24 NOTE — ED Provider Notes (Addendum)
HPI Comments: Pt with history of seizures presenting with seizures.  Son notes 1 seizure (2 min duration) prior to EMS arrival, EMS witnessed 3 total.  Pt given 2 x 2.5mg  valium by EMS prior to arrival.  No incontinence, no tongue biting.Pt also noted to have BS 58, was given oral glucose once awake and improved to 98.  Pt seized shortly after arrival to ED and was given 2mg  ativan.  Denies recent changes in antiepileptics, lack of sleep, alcohol/drug intake, head trauma.  States she has seizures when stressed or when has migraine, last seizure 3 month ago.  Had recent URI (cough, rhinorrhea).  Currently with migraine (similar to prior migraines).  Denies fever, neck stiffness, dysuria, nausea, vomiting.    PMH- seizure, fibromyalgia, migraine, arthritis  Meds- Trileptal 300mg  (afternoon), Topomax 100mg  morning, Topomax 200mg  afternoon, valium PRN, hydrocodone PRN, percocet PRN    Patient is a 33 y.o. female presenting with seizures, hypoglycemia, and migraine. The history is provided by the patient and the EMS personnel.   Seizure   Pertinent negatives include no headaches, no speech difficulty, no visual disturbance, no sore throat, no chest pain, no cough, no nausea, no vomiting and no diarrhea.   She reports no chest pain, no visual disturbance, no diarrhea, no vomiting, no headaches, no sore throat, no speech difficulty and no cough.   Low Blood Sugar  Pertinent negatives include no chest pain, no abdominal pain, no headaches and no shortness of breath.   Migraine   Pertinent negatives include no fever, no palpitations, no shortness of breath, no weakness, no dizziness, no nausea and no vomiting.        Past Medical History   Diagnosis Date   ??? Arthritis    ??? Seizures    ??? Other ill-defined conditions      fibromyalgia   ??? Other ill-defined conditions      chronic ear disease    ??? Other ill-defined conditions      toxic mildew syndrome          Past Surgical History   Procedure Date   ??? Hx orthopaedic       left arm    ??? Hx heent            No family history on file.     History   Social History   ??? Marital Status: Single     Spouse Name: N/A     Number of Children: N/A   ??? Years of Education: N/A   Occupational History   ??? Not on file.   Social History Main Topics   ??? Tobacco Use: Yes -- 0.5 packs/day   ??? Alcohol Use: No   ??? Drug Use: No   ??? Sexually Active: Yes -- Female partner(s)   Other Topics Concern   ??? Not on file   Social History Narrative   ??? No narrative on file           ALLERGIES: Pcn, Sulfa (sulfonamide antibiotics), Ketorolac, Ciprofloxacin, Benadryl, Darvocet a500 and Aspirin      Review of Systems   Constitutional: Negative.  Negative for fever, chills, diaphoresis, activity change, appetite change, fatigue and unexpected weight change.   HENT: Positive for rhinorrhea and neck pain (after seizure in ED noted left side neck pain, none on arrival). Negative for hearing loss, congestion, sore throat, sneezing, trouble swallowing, neck stiffness and voice change.    Eyes: Negative.  Negative for visual disturbance.   Respiratory: Negative.  Negative for apnea, cough, choking, chest tightness and shortness of breath.    Cardiovascular: Negative.  Negative for chest pain, palpitations and leg swelling.   Gastrointestinal: Negative.  Negative for nausea, vomiting, abdominal pain, diarrhea, constipation, blood in stool and abdominal distention.   Genitourinary: Negative.  Negative for dysuria and hematuria.        No discharge   Musculoskeletal: Negative for myalgias, back pain and arthralgias.   Skin: Negative.  Negative for rash and color change.   Neurological: Negative.  Negative for dizziness, seizures, syncope, facial asymmetry, speech difficulty, weakness, light-headedness, numbness and headaches.   Hematological: Negative.    Psychiatric/Behavioral: Negative.    All other systems reviewed and are negative.        Filed Vitals:     11/24/2008 12:06 PM 11/24/2008 12:15 PM 11/24/2008 12:30 PM 11/24/2008  1:00 PM   BP: 93/62 90/49 97/56     Pulse:       Temp:       Resp:  10 10 14    Weight:       SpO2: 99% 100% 99%               Physical Exam   Nursing note and vitals reviewed.  Constitutional: She is oriented to person, place, and time. She appears well-developed and well-nourished.  Non-toxic appearance. She does not have a sickly appearance. She does not appear ill. No distress.        Pt had generalized seizure shortly after arrival, loose muscle tone during seizure until lifted left arm to place over head, was able to direct to left side of body from head   HENT:   Head: Normocephalic and atraumatic. Head is without abrasion and without laceration.   Mouth/Throat: Oropharynx is clear and moist and mucous membranes are normal. No oropharyngeal exudate, posterior oropharyngeal edema or posterior oropharyngeal erythema.        No lacs on mouth or tongue.  No signs of trauma on head   Eyes: Conjunctivae are normal. Pupils are equal, round, and reactive to light. Right eye exhibits no discharge. Left eye exhibits no discharge. No scleral icterus. Right eye exhibits normal extraocular motion. Left eye exhibits normal extraocular motion.   Neck: Trachea normal, normal range of motion and full passive range of motion without pain. Neck supple. Muscular tenderness (left lateral neck mild ttp) present. No spinous process tenderness present. No rigidity. Normal range of motion present.   Cardiovascular: Normal rate, regular rhythm, intact distal pulses and normal pulses.  Exam reveals no gallop and no friction rub.    No murmur heard.  Pulmonary/Chest: Effort normal and breath sounds normal. No accessory muscle usage. No respiratory distress. She has no decreased breath sounds. She has no wheezes. She has no rales. She exhibits no tenderness.    Abdominal: Soft. Bowel sounds are normal. She exhibits no distension. No tenderness. She has no rebound and no guarding.   Musculoskeletal: Normal range of motion. She exhibits no edema and no tenderness.   Lymphadenopathy:     She has no cervical adenopathy.   Neurological: She is alert and oriented to person, place, and time. She has normal strength. No cranial nerve deficit or sensory deficit. Coordination normal.   Skin: Skin is warm, dry and intact. No abrasion and no rash noted. No erythema. No pallor.   Psychiatric: She has a normal mood and affect. Her speech is normal.        Coding    Procedures  EKG interpretation: (Preliminary)  Rhythm: normal sinus rhythm; and regular . Rate (approx.): 56; Axis: normal; P wave: normal; QRS interval: normal ; ST/T wave: normal; in  Lead: ; Other findings: QTc 428    3:08 PM  Pt doing well. No further seizure activity. Resting comfortably.    4:09 PM  Pt without further seizure. BS 70s, will feed. Will give med for HA.  Will have wait in CDU for transportation home. Will have follow up with neurology to discuss seizure medications.    I was personally available for consultation in the emergency department.  I have reviewed the chart and agree with the documentation recorded by the Resident, including the assessment, treatment plan, and disposition.  Liberty Handy Joelene Millin, MD

## 2008-11-24 NOTE — ED Notes (Signed)
Pt walked to BR without difficulty.  Back to bed and placed on monitor.  Snack provided for pts son, accompanied to cafeteria for replacement of change lost in vending.  Meal tray ordered.  Pt positioned for comfort, warm blanket provided.

## 2008-11-24 NOTE — ED Notes (Signed)
Pt has to be woken up to take med's then falls asleep her son is still here, says it will be a while until step father can come get him

## 2008-11-24 NOTE — ED Notes (Signed)
Pt arrives by EMS pt had seizzure at home, 2 in ambulance . One here pt was medicated 5 valium by EMS . MD was at bedside . Son with pt .

## 2008-11-24 NOTE — ED Notes (Signed)
Assumed care of patient Patient son present at bedside Patient resting comfortably in bed with eyes closed Patient increasingly sleeping will open eyes to verbal stimuli IVF infusing via 20 gauge in R AC intact without signs of infiltration Patient placed on monitor x 3 SR up x 2 call bell within reach Patient updated on plan of care of monitoring for seizures Patient states ride may be present in ED at approx 1900

## 2008-11-24 NOTE — ED Notes (Signed)
Pt alert x3 when stimulated.  Insstructed to sit up at bedside.  States her neck and head hurt at present now a 10/10.  VSS while sitting. Son back to bedside.

## 2008-11-24 NOTE — ED Notes (Signed)
SBP 88; pt instructed to move arm down to her side when BP cycles.  Accucheck done, 75.  Pt requesting something to eat.  Plan to transfer to CDU discussed with pt.

## 2008-11-25 LAB — EKG, 12 LEAD, INITIAL
Atrial Rate: 56 {beats}/min
Calculated P Axis: 60 degrees
Calculated R Axis: 73 degrees
Calculated T Axis: 47 degrees
P-R Interval: 130 ms
Q-T Interval: 444 ms
QRS Duration: 72 ms
QTC Calculation (Bezet): 428 ms
Ventricular Rate: 56 {beats}/min

## 2008-12-30 NOTE — ED Provider Notes (Addendum)
HPI Comments: Patient is a 33 yo WF with H/O epilepsy presenting ambulatory to ED with C/O gradual onset of HA with nausea x yesterday morning. Pt reports HA was "faint" upon first onset and has gradually worsened to 10/10 pain. Pt denies any vomiting, CP, SOB, ABD pain, F/C, neck pain, seizure activity, or other sx. Tx tried include Fioricet, Percocet, and Hydrocodone with no relief.     EtOH Use: Denies  Tobacco Use: Yes; 4 cigarettes/day    PMHx on File includes arthritis, fibromyalgia, seizures, chronic ear disease, toxic mildew syndrome  PSHx on File includes LUE surgery, HEENT surgery  PCP - Caryn Section, MD    There are no other sx noted. Patient and family have no further complaints at this time.   Written by Guadlupe Spanish Excell Seltzer, ED Scribe, as dictated by Estrella Deeds, MD.    The history is provided by the patient and a relative. No language interpreter was used.        Past Medical History   Diagnosis Date   ??? Arthritis    ??? Seizures    ??? Other ill-defined conditions      fibromyalgia   ??? Other ill-defined conditions      chronic ear disease    ??? Other ill-defined conditions      toxic mildew syndrome          Past Surgical History   Procedure Date   ??? Hx orthopaedic      left arm    ??? Hx heent            No family history on file.     History   Social History   ??? Marital Status: Single     Spouse Name: N/A     Number of Children: N/A   ??? Years of Education: N/A   Occupational History   ??? Not on file.   Social History Main Topics   ??? Smoking status: Current Everyday Smoker -- 0.5 packs/day   ??? Smokeless tobacco: Never Used   ??? Alcohol Use: No   ??? Drug Use: No   ??? Sexually Active: Yes -- Female partner(s)   Other Topics Concern   ??? Not on file   Social History Narrative   ??? No narrative on file           ALLERGIES: Pcn, Sulfa (sulfonamide antibiotics), Ketorolac, Ciprofloxacin, Benadryl, Darvocet a500 and Aspirin      Review of Systems   Constitutional: Negative.  Negative for fever and chills.    HENT: Negative for neck pain and neck stiffness.    Eyes: Negative.    Respiratory: Negative.  Negative for shortness of breath.    Cardiovascular: Negative.  Negative for chest pain.   Gastrointestinal: Positive for nausea. Negative for vomiting and abdominal pain.   Genitourinary: Negative.    Musculoskeletal: Negative.    Skin: Negative.    Neurological: Positive for headaches. Negative for seizures.   Hematological: Negative.    Psychiatric/Behavioral: Negative.    All other systems reviewed and are negative.        Filed Vitals:    12/30/2008  8:45 PM   BP: 130/80   Pulse: 71   Temp: 97.7 ??F (36.5 ??C)   Resp: 16   Height: 5\' 4"  (1.626 m)   Weight: 132 lb 11.5 oz (60.2 kg)   SpO2: 99%                Physical Exam  GEN: NAD  HENT: moist oral mucosa, no erythema or exudate of pharynx, no erythema or edema of nasal mucosa  Eyes: PERRL, EOMI  NECK: no tenderness, painless ROM, no lymphadenopathy, no meningeal signs  CARD: RRR, no murmur, rub or gallop  LUNG: clear to ausculation bilaterally without wheezes, rhonchi or crackles  ABD: soft, non-tender, non-distended, no rebound or guarding, bowel sounds WNL  EXT: full and painless ROM, no swelling or deformity noted  SKIN: no rash noted  NEURO: alert and nonfocal, CN I-XII grossly intact; normal finger to nose, normal gait  Written by Guadlupe Spanish. Excell Seltzer, ED Scribe, as dictated by Estrella Deeds, MD.    Coding    Procedures    EKG interpretation: (Preliminary)  Rhythm: sinus bradycardia; and regular . Rate (approx.): 43; Axis: normal; P wave: normal; QRS interval: normal ; ST/T wave: non-specific changes;    Patient reports she has "slow heart rate" at all times- prior EMR shows HR in 50s    11:30 PM  Patient reports she is significantly better now and is ready to go home - I discussed dx, treatment plan, need for FU and reasons to return to the ER with the patient and her family - symptomatic treatment of migraine can be further managed as outpatient

## 2008-12-30 NOTE — ED Notes (Signed)
Pt states she has a Hx of bradycardia including HR in the upper 30's

## 2008-12-30 NOTE — ED Notes (Signed)
Pt discharged to home at this time with self and husband. Pt provided with written instructions and prescriptions. Pt IV discontinued with catheter intact.

## 2008-12-30 NOTE — ED Notes (Signed)
Pt HR in low 40's Dr Littie Deeds made aware and ekg preformed, pt a&o x 3, alert and awake

## 2008-12-30 NOTE — ED Notes (Signed)
Pt presents with headache since yesterday evening, states that pt has seizures after her headaches come

## 2008-12-31 LAB — EKG, 12 LEAD, INITIAL
Atrial Rate: 43 {beats}/min
Calculated P Axis: 125 degrees
Calculated R Axis: 113 degrees
Calculated T Axis: 138 degrees
P-R Interval: 144 ms
Q-T Interval: 506 ms
QRS Duration: 80 ms
QTC Calculation (Bezet): 427 ms
Ventricular Rate: 43 {beats}/min

## 2008-12-31 MED ORDER — HYDROMORPHONE (PF) 1 MG/ML IJ SOLN
1 mg/mL | INTRAMUSCULAR | Status: AC
Start: 2008-12-31 — End: 2008-12-30
  Administered 2008-12-31: 04:00:00 via INTRAVENOUS

## 2008-12-31 MED ORDER — SODIUM CHLORIDE 0.9 % IJ SYRG
INTRAMUSCULAR | Status: AC
Start: 2008-12-31 — End: 2008-12-30
  Administered 2008-12-31: 04:00:00

## 2008-12-31 MED ORDER — DIAZEPAM 5 MG/ML SYRINGE
5 mg/mL | INTRAMUSCULAR | Status: AC
Start: 2008-12-31 — End: 2008-12-30
  Administered 2008-12-31: 03:00:00 via INTRAVENOUS

## 2008-12-31 MED ORDER — BUTALBITAL-ACETAMINOPHEN-CAFFEINE 50 MG-325 MG-40 MG TAB
50-325-40 mg | ORAL_TABLET | Freq: Four times a day (QID) | ORAL | Status: DC | PRN
Start: 2008-12-31 — End: 2009-05-06

## 2008-12-31 MED ORDER — SODIUM CHLORIDE 0.9% BOLUS IV
0.9 % | INTRAVENOUS | Status: AC
Start: 2008-12-31 — End: 2008-12-30
  Administered 2008-12-31: 03:00:00 via INTRAVENOUS

## 2008-12-31 MED ORDER — PROCHLORPERAZINE EDISYLATE 5 MG/ML INJECTION
5 mg/mL | INTRAMUSCULAR | Status: AC
Start: 2008-12-31 — End: 2008-12-30
  Administered 2008-12-31: 03:00:00 via INTRAVENOUS

## 2008-12-31 MED FILL — HYDROMORPHONE (PF) 1 MG/ML IJ SOLN: 1 mg/mL | INTRAMUSCULAR | Qty: 1

## 2008-12-31 MED FILL — SALINE FLUSH INJECTION SYRINGE: INTRAMUSCULAR | Qty: 20

## 2008-12-31 MED FILL — DIAZEPAM 5 MG/ML SYRINGE: 5 mg/mL | INTRAMUSCULAR | Qty: 2

## 2008-12-31 MED FILL — SODIUM CHLORIDE 0.9 % IV: INTRAVENOUS | Qty: 1000

## 2008-12-31 MED FILL — SALINE FLUSH INJECTION SYRINGE: INTRAMUSCULAR | Qty: 10

## 2008-12-31 MED FILL — PROCHLORPERAZINE EDISYLATE 5 MG/ML INJECTION: 5 mg/mL | INTRAMUSCULAR | Qty: 2

## 2009-01-18 NOTE — ED Notes (Signed)
CT contacted regarding study, advised that if patient is ready that they will come and get her.  Confirmed patient has been ready.

## 2009-01-18 NOTE — ED Provider Notes (Signed)
HPI Comments: This is a 34 yo WF with hx of seizures who presents to ED with cc of seizure. Pt presents to ED currently seizing. Pt's husband reports that when he got home, pt said she was not feeling well and went to lie down. Per husband, he heard a cough and went to check on pt who was seizing for a brief period of time. Husband says that seizure stopped but returned and persisted after the pt got into a verbal altercation with someone else. Husband says that he then drove pt to ED. Pt says that she has taken all her medication today  Pt reports HA. 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 Pt says that she urinated on herself while seizing, which has not happened in the past.  Pt denies illicit drug use.    PCP: Caryn Section, MD  Neurologist: Jerrilyn Cairo, MD (formerly saw Roxine Caddy, MD)     There are no other complaints, changes or physical findings at this time. 9:54 PM   Written by Cliffton Asters Clydene Pugh, ED Scribe, as dictated by Ventura Bruns, MD.      The history is provided by the patient and the spouse.        Past Medical History   Diagnosis Date   ??? Arthritis    ??? Seizures    ??? Other ill-defined conditions      fibromyalgia   ??? Other ill-defined conditions      chronic ear disease    ??? Other ill-defined conditions      toxic mildew syndrome          Past Surgical History   Procedure Date   ??? Hx orthopaedic      left arm    ??? Hx heent            No family history on file.     History   Social History   ??? Marital Status: Single     Spouse Name: N/A     Number of Children: N/A   ??? Years of Education: N/A   Occupational History   ??? Not on file.   Social History Main Topics   ??? Smoking status: Current Everyday Smoker -- 0.5 packs/day   ??? Smokeless tobacco: Never Used   ??? Alcohol Use: No   ??? Drug Use: No   ??? Sexually Active: Yes -- Female partner(s)   Other Topics Concern   ??? Not on file   Social History Narrative   ??? No narrative on file            ALLERGIES: Pcn, Sulfa (sulfonamide antibiotics), Ketorolac, Ciprofloxacin, Benadryl, Darvocet a500 and Aspirin      Review of Systems   Constitutional: Negative.    Eyes: Negative.    Respiratory: Negative.    Cardiovascular: Negative.    Gastrointestinal: Negative.    Genitourinary: Positive for enuresis (See HPI).   Musculoskeletal: Negative.    Skin: Negative.    Neurological: Positive for seizures and headaches.       Filed Vitals:    01/18/2009  9:45 PM 01/18/2009 10:00 PM 01/18/2009 10:15 PM 01/18/2009 11:06 PM   BP: 101/74 100/56 100/55    Pulse: 86 68 70    Temp: 97.9 ??F (36.6 ??C)      Resp: 19 16 19 16    Height: 5\' 4"  (1.626 m)      Weight: 130 lb (58.968  kg)      SpO2: 100% 100% 100% 99%       Recent Results (from the past 12 hour(s))   CBC W/O DIFF    Collection Time    01/18/09  9:45 PM   Component Value Range   ??? WBC 15.2 (*) 3.6 - 11.0 (K/uL)   ??? RBC 4.69  3.80 - 5.20 (M/uL)   ??? HGB 15.6  11.5 - 16.0 (g/dL)   ??? HCT 45.7  35.0 - 47.0 (%)   ??? MCV 97.4  80.0 - 99.0 (FL)   ??? MCH 33.3  26.0 - 34.0 (PG)   ??? MCHC 34.1  30.0 - 36.5 (g/dL)   ??? RDW 13.7  11.5 - 14.5 (%)   ??? PLATELET 287  150 - 400 (K/uL)   METABOLIC PANEL, COMPREHENSIVE    Collection Time    01/18/09  9:45 PM   Component Value Range   ??? Sodium 137  136 - 145 (MMOL/L)   ??? Potassium 4.0  3.5 - 5.1 (MMOL/L)   ??? Chloride 102  97 - 108 (MMOL/L)   ??? CO2 27  21 - 32 (MMOL/L)   ??? Anion gap 8  5 - 15 (mmol/L)   ??? Glucose 78  65 - 100 (MG/DL)   ??? BUN 15  6 - 20 (MG/DL)   ??? Creatinine 0.8  0.6 - 1.3 (MG/DL)   ??? BUN/Creatinine ratio 19  12 - 20 ( )   ??? GFR est AA >60  >60 (ml/min/1.50m2)   ??? GFR est non-AA >60  >60 (ml/min/1.16m2)   ??? Calcium 9.1  8.5 - 10.1 (MG/DL)   ??? Bilirubin, total 0.3  0.2 - 1.0 (MG/DL)   ??? ALT 27  12 - 78 (U/L)   ??? AST 18  15 - 37 (U/L)   ??? Alk. phosphatase 106  50 - 136 (U/L)   ??? Protein, total 7.8  6.4 - 8.2 (g/dL)   ??? Albumin 3.8  3.5 - 5.0 (g/dL)   ??? Globulin 4.0  2.0 - 4.0 (g/dL)   ??? A-G Ratio 1.0 (*) 1.1 - 2.2 ( )    HCG QL SERUM    Collection Time    01/18/09  9:45 PM   Component Value Range   ??? HCG, Ql. NEGATIVE   NEGATIVE    CK    Collection Time    01/18/09  9:45 PM   Component Value Range   ??? CK 21  21 - 215 (U/L)   GLUCOSE, POC    Collection Time    01/18/09  9:49 PM   Component Value Range   ??? POC GLUCOSE 86  65 - 105 (mg/dL)   URINALYSIS W/ REFLEX CULTURE    Collection Time    01/18/09 10:15 PM   Component Value Range   ??? Color YELLOW     ??? Appearance CLOUDY     ??? Specific gravity 1.020  1.003 - 1.030 ( )   ??? pH 6.0  5.0 - 8.0 ( )   ??? Protein NEGATIVE   NEGATIVE (MG/DL)   ??? Glucose 409 (*) NEGATIVE (MG/DL)   ??? Ketone NEGATIVE   NEGATIVE (MG/DL)   ??? Bilirubin NEGATIVE   NEGATIVE    ??? Blood NEGATIVE   NEGATIVE    ??? Urobilinogen 0.2  0.2 - 1.0 (EU/DL)   ??? Nitrites NEGATIVE   NEGATIVE    ??? Leukocyte Esterase MODERATE (*) NEGATIVE    ??? WBC 10-20  0 -  4 (/HPF)   ??? RBC 0-3  0 - 5 (/HPF)   ??? Epithelial cells 10-20  0 - 5 (/LPF)   ??? Bacteria 2+ (*) NEGATIVE (/HPF)   ??? UA:UC IF INDICATED URINE CULTURE ORDERED     ??? Mucus 1+ (*) NEGATIVE (/LPF)   ??? Hyaline Cast 0-2  0 - 2    DRUG SCREEN UR - NO CONFIRM    Collection Time    01/18/09 10:15 PM   Component Value Range   ??? PCP(PHENCYCLIDINE) NEGATIVE   NEGATIVE    ??? BENZODIAZEPINE POSITIVE (*) NEGATIVE    ??? COCAINE NEGATIVE   NEGATIVE    ??? AMPHETAMINE NEGATIVE   NEGATIVE    ??? THC (TH-CANNABINOL) POSITIVE (*) NEGATIVE    ??? OPIATES POSITIVE (*) NEGATIVE    ??? BARBITURATES NEGATIVE   NEGATIVE    ??? TRICYCLICS NEGATIVE   NEGATIVE    ??? DRUG SCRN COMMENT        Value: These medical screening tests have not been confirmed by an alternate method.                  Physical Exam   Nursing note and vitals reviewed.   Physical Examination: General appearance - WDWN, intermittent shaking-full body jerking with purposeful movements  Head - NC/AT  Eyes - pupils equal, round  and reactive, extraocular eye movements intact, conj/sclera clear, anicteric   Mouth - mucous membranes moist, pharynx normal without lesions  Nose/Ears - nares clear, Tms & canals clear  Neck - supple, no significant adenopathy, trachea midline, no crepitus  Chest - Normal respiratory effort, clear to auscultation bilaterally, no wheezes/rales/rhonchi  Heart - normal rate and regular rhythm, S1 and S2 normal, no murmurs, gallops, or rubs noted  Abdomen - soft, nontender, nondistended, nabs, no masses, guarding, rebound or rigidity  Neurological - alert, oriented, normal speech, cranial nerves intact, no focal motor findings, motor & sensory diffusely intact  Extremities - peripheral pulses normal, no pedal edema, all joints atraumatic, FROM, non-tender, no gross deformities  Skin - normal coloration and turgor, no rashes, no lesions or lacerations        MDM Coding   Reviewed: previous chart, nursing note and vitals  Reviewed previous: labs and ECG  Interpretation: labs, ECG and CT scan  Total time providing critical care: 75-105 minutes. This excludes time spent performing separately reportable procedures and services.  Consults: neurology        Procedures    10:25 PM  Pt is having another shaking episode--seizure vs pseudoseizure, pt states she has these episodes from "pain", anxiolytics ordered, pain meds..  Written by Cliffton Asters Clydene Pugh, ED Scribe, as dictated by Ventura Bruns, *.    CONSULT NOTE:   10:38 PM  Caliah Kopke, * spoke with Scarlett Presto, MD   Specialty: Neurology  Discussed pt's hx, disposition, and available diagnostic and imaging results. Reviewed care plans. Consulting physician agrees with plans as outlined.  Dr. Alison Murray would like doses of Tegretol and Topamax administered to pt. She would like to be re-contacted if pt has another seizure episode.   Written by Cliffton Asters Clydene Pugh, ED Scribe, as dictated by Ventura Bruns, *.    10:49 PM  Pt says that she is feeling better. Pt is in no apparent distress. Pt has UTI and will be given ABX's.   Written by Cliffton Asters Clydene Pugh, ED Scribe, as dictated by Ventura Bruns, *.    11:03 PM  Pt had another seizure in ED. Samai Corea, *  will consult with Dr. Alison Murray.  Written by Cliffton Asters Asher, ED Scribe, as dictated by Ventura Bruns, *.    11:06 PM   Pt reports feeling better after last shaking episode. No post-ictal state  Written by Cliffton Asters. Clydene Pugh, ED Scribe, as dictated by Ventura Bruns, *.    Pt states she "will only be admitted if I can have pain control"    CONSULT NOTE:   11:07 PM  Kathy Wares, * spoke with Scarlett Presto, MD  Specialty: Neurology  Discussed pt's hx, disposition, and available diagnostic and imaging results. Reviewed care plans. Consulting physician agrees with plans as outlined.  Dr. 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  Written by Cliffton Asters Clydene Pugh, ED Scribe, as dictated by Ventura Bruns, *.

## 2009-01-18 NOTE — ED Notes (Signed)
MD at bedside.

## 2009-01-18 NOTE — ED Notes (Signed)
Assumed care of pt.  Assessment completed.

## 2009-01-18 NOTE — ED Notes (Signed)
Another nurse advised that patient was having a seizure, Dr. Dorothey Baseman at bedside attempting to arouse patient.  She does not want any chemical therapy at this time.

## 2009-01-18 NOTE — ED Notes (Signed)
TRANSFER - IN REPORT:    Verbal report received from [place] on Shannon [name]  for routine progression of care      Report consisted of patient???s Situation, Background, Assessment and   Recommendations(SBAR).     Information from the following report(s) SBAR, ED Summary, [facility] and Recent Results was reviewed with the receiving nurse.    Opportunity for questions and clarification was provided.      Assessment completed upon patient???s arrival to unit and care assumed.

## 2009-01-19 LAB — URINALYSIS W/ REFLEX CULTURE
Bilirubin: NEGATIVE
Blood: NEGATIVE
Glucose: 100 MG/DL — AB
Ketone: NEGATIVE MG/DL
Nitrites: NEGATIVE
Protein: NEGATIVE MG/DL
Specific gravity: 1.02 (ref 1.003–1.030)
Urobilinogen: 0.2 EU/DL (ref 0.2–1.0)
pH (UA): 6 (ref 5.0–8.0)

## 2009-01-19 LAB — METABOLIC PANEL, COMPREHENSIVE
A-G Ratio: 1 — ABNORMAL LOW (ref 1.1–2.2)
ALT (SGPT): 27 U/L (ref 12–78)
AST (SGOT): 18 U/L (ref 15–37)
Albumin: 3.8 g/dL (ref 3.5–5.0)
Alk. phosphatase: 106 U/L (ref 50–136)
Anion gap: 8 mmol/L (ref 5–15)
BUN/Creatinine ratio: 19 (ref 12–20)
BUN: 15 MG/DL (ref 6–20)
Bilirubin, total: 0.3 MG/DL (ref 0.2–1.0)
CO2: 27 MMOL/L (ref 21–32)
Calcium: 9.1 MG/DL (ref 8.5–10.1)
Chloride: 102 MMOL/L (ref 97–108)
Creatinine: 0.8 MG/DL (ref 0.6–1.3)
GFR est AA: >60 mL/min/{1.73_m2} (ref >60)
GFR est non-AA: >60 mL/min/{1.73_m2} (ref >60)
Globulin: 4 g/dL (ref 2.0–4.0)
Glucose: 78 MG/DL (ref 65–100)
Potassium: 4 MMOL/L (ref 3.5–5.1)
Protein, total: 7.8 g/dL (ref 6.4–8.2)
Sodium: 137 MMOL/L (ref 136–145)

## 2009-01-19 LAB — CBC W/O DIFF
HCT: 45.7 % (ref 35.0–47.0)
HGB: 15.6 g/dL (ref 11.5–16.0)
MCH: 33.3 PG (ref 26.0–34.0)
MCHC: 34.1 g/dL (ref 30.0–36.5)
MCV: 97.4 FL (ref 80.0–99.0)
PLATELET: 287 10*3/uL (ref 150–400)
RBC: 4.69 M/uL (ref 3.80–5.20)
RDW: 13.7 % (ref 11.5–14.5)
WBC: 15.2 10*3/uL — ABNORMAL HIGH (ref 3.6–11.0)

## 2009-01-19 LAB — DRUG SCREEN, URINE
AMPHETAMINES: NEGATIVE
BARBITURATES: NEGATIVE
BENZODIAZEPINES: POSITIVE — AB
COCAINE: NEGATIVE
OPIATES: POSITIVE — AB
PCP(PHENCYCLIDINE): NEGATIVE
THC (TH-CANNABINOL): POSITIVE — AB
TRICYCLICS: NEGATIVE

## 2009-01-19 LAB — CK: CK: 21 U/L (ref 21–215)

## 2009-01-19 LAB — GLUCOSE, POC: Glucose (POC): 86 mg/dL (ref 65–105)

## 2009-01-19 LAB — HCG QL SERUM: HCG, Ql.: NEGATIVE

## 2009-01-19 LAB — CARBAMAZEPINE: Carbamazepine: <0.5 ug/mL — ABNORMAL LOW (ref 4.0–12.0)

## 2009-01-19 LAB — PROLACTIN: Prolactin: 9.4 ng/mL

## 2009-01-19 MED ORDER — CARBAMAZEPINE 200 MG TAB
200 mg | ORAL | Status: AC
Start: 2009-01-19 — End: 2009-01-18
  Administered 2009-01-19: 04:00:00 via ORAL

## 2009-01-19 MED ORDER — SODIUM CHLORIDE 0.9 % IJ SYRG
INTRAMUSCULAR | Status: AC
Start: 2009-01-19 — End: 2009-01-18
  Administered 2009-01-19: 03:00:00

## 2009-01-19 MED ORDER — TOPIRAMATE 100 MG TAB
100 mg | Freq: Once | ORAL | Status: AC
Start: 2009-01-19 — End: 2009-01-18
  Administered 2009-01-19: 04:00:00 via ORAL

## 2009-01-19 MED ORDER — LORAZEPAM 2 MG/ML IJ SOLN
2 mg/mL | INTRAMUSCULAR | Status: AC
Start: 2009-01-19 — End: 2009-01-18
  Administered 2009-01-19: 03:00:00 via INTRAVENOUS

## 2009-01-19 MED ORDER — DEXTROSE 50% IN WATER (D50W) IV SYRG
INTRAVENOUS | Status: AC
Start: 2009-01-19 — End: 2009-01-18

## 2009-01-19 MED ORDER — MORPHINE 2 MG/ML INJECTION
2 mg/mL | INTRAMUSCULAR | Status: AC
Start: 2009-01-19 — End: 2009-01-18
  Administered 2009-01-19: 04:00:00 via INTRAVENOUS

## 2009-01-19 MED ORDER — CEFTRIAXONE 1 GRAM SOLUTION FOR INJECTION
1 gram | INTRAMUSCULAR | Status: AC
Start: 2009-01-19 — End: 2009-01-18
  Administered 2009-01-19: 04:00:00 via INTRAVENOUS

## 2009-01-19 MED ORDER — LORAZEPAM 2 MG/ML IJ SOLN
2 mg/mL | INTRAMUSCULAR | Status: AC
Start: 2009-01-19 — End: 2009-01-18

## 2009-01-19 MED ORDER — OXYCODONE-ACETAMINOPHEN 5 MG-325 MG TAB
5-325 mg | ORAL | Status: AC
Start: 2009-01-19 — End: 2009-01-19
  Administered 2009-01-19: 06:00:00 via ORAL

## 2009-01-19 MED ORDER — NITROFURANTOIN (25% MACROCRYSTAL FORM) 100 MG CAP
100 mg | ORAL_CAPSULE | Freq: Two times a day (BID) | ORAL | Status: AC
Start: 2009-01-19 — End: 2009-01-29

## 2009-01-19 MED ORDER — DEXTROSE 50% IN WATER (D50W) IV SYRG
INTRAVENOUS | Status: AC
Start: 2009-01-19 — End: 2009-01-18
  Administered 2009-01-19: 03:00:00

## 2009-01-19 MED ORDER — AZITHROMYCIN 250 MG TAB
250 mg | PACK | ORAL | Status: AC
Start: 2009-01-19 — End: 2009-01-24

## 2009-01-19 MED ORDER — ONDANSETRON 4 MG TAB, RAPID DISSOLVE
4 mg | ORAL | Status: AC
Start: 2009-01-19 — End: 2009-01-19
  Administered 2009-01-19: 06:00:00 via ORAL

## 2009-01-19 MED ORDER — HYDROMORPHONE (PF) 1 MG/ML IJ SOLN
1 mg/mL | INTRAMUSCULAR | Status: AC
Start: 2009-01-19 — End: 2009-01-18
  Administered 2009-01-19: 04:00:00 via INTRAVENOUS

## 2009-01-19 MED ORDER — SODIUM CHLORIDE 0.9% BOLUS IV
0.9 % | INTRAVENOUS | Status: AC
Start: 2009-01-19 — End: 2009-01-18
  Administered 2009-01-19: 03:00:00 via INTRAVENOUS

## 2009-01-19 MED ORDER — LORAZEPAM 2 MG/ML IJ SOLN
2 mg/mL | INTRAMUSCULAR | Status: AC
Start: 2009-01-19 — End: 2009-01-18
  Administered 2009-01-19: 03:00:00

## 2009-01-19 MED FILL — CEFTRIAXONE 1 GRAM SOLUTION FOR INJECTION: 1 gram | INTRAMUSCULAR | Qty: 1

## 2009-01-19 MED FILL — TOPIRAMATE 100 MG TAB: 100 mg | ORAL | Qty: 2

## 2009-01-19 MED FILL — SALINE FLUSH INJECTION SYRINGE: INTRAMUSCULAR | Qty: 20

## 2009-01-19 MED FILL — DEXTROSE 50% IN WATER (D50W) IV SYRG: INTRAVENOUS | Qty: 50

## 2009-01-19 MED FILL — ONDANSETRON 4 MG TAB, RAPID DISSOLVE: 4 mg | ORAL | Qty: 1

## 2009-01-19 MED FILL — OXYCODONE-ACETAMINOPHEN 5 MG-325 MG TAB: 5-325 mg | ORAL | Qty: 2

## 2009-01-19 MED FILL — LORAZEPAM 2 MG/ML IJ SOLN: 2 mg/mL | INTRAMUSCULAR | Qty: 1

## 2009-01-19 MED FILL — SODIUM CHLORIDE 0.9 % IV: INTRAVENOUS | Qty: 1000

## 2009-01-19 MED FILL — SALINE FLUSH INJECTION SYRINGE: INTRAMUSCULAR | Qty: 10

## 2009-01-19 MED FILL — CARBAMAZEPINE 200 MG TAB: 200 mg | ORAL | Qty: 2

## 2009-01-19 MED FILL — HYDROMORPHONE (PF) 1 MG/ML IJ SOLN: 1 mg/mL | INTRAMUSCULAR | Qty: 1

## 2009-01-19 MED FILL — MORPHINE 2 MG/ML INJECTION: 2 mg/mL | INTRAMUSCULAR | Qty: 1

## 2009-01-19 NOTE — ED Notes (Signed)
TRANSFER - OUT REPORT:    Verbal report given to [facility] on Shannon [name]  being transferred to CDU 34 for routine progression of care       Report consisted of patient???s Situation, Background, Assessment and   Recommendations(SBAR).     Information from the following report(s) SBAR, ED Summary, [facility] and Recent Results was reviewed with the receiving nurse.    Opportunity for questions and clarification was provided.

## 2009-01-19 NOTE — ED Notes (Signed)
Patient requesting more pain medication.

## 2009-01-19 NOTE — ED Notes (Addendum)
Pt discharge home. Pt verbalized understanding about plan of care. Pt  denied any questions about diagnosis or tests. Pt wheeled out no distress noted. Vital signs were stable. IV site free of edema or redness. Pt was accompanied by  Husband and son

## 2009-01-19 NOTE — ED Notes (Signed)
Dr Dorothey Baseman at bedside discussing  Plan of care

## 2009-01-19 NOTE — ED Notes (Signed)
Pt stated her pain is a 5/10 but she ussualy feels that pain level because she has fibromyalgia.

## 2009-01-19 NOTE — ED Notes (Signed)
Pt extremely upset with the staff because MD did not order her 2 percocet and 2 Vicodin pills at the same. MD had discussed with patient that we cannot give her both medications at the same time. Pt barely awake unable to keep her eyes open stated " I am not staying if they are not giving me the medications I am acostume to take. " "You need to find out if I am going to be admitted because if you do not know I am leaving right know" MD notified

## 2009-01-20 LAB — CULTURE, URINE
Colonies Counted: <10000
Colony Count: <10000
Culture result:: NO GROWTH
Culture: NO GROWTH

## 2009-01-20 LAB — METABOLIC PANEL, COMPREHENSIVE
A-G Ratio: 0.9 — ABNORMAL LOW (ref 1.1–2.2)
ALT (SGPT): 28 U/L (ref 12–78)
AST (SGOT): 20 U/L (ref 15–37)
Albumin: 3.6 g/dL (ref 3.5–5.0)
Alk. phosphatase: 95 U/L (ref 50–136)
Anion gap: 7 mmol/L (ref 5–15)
BUN/Creatinine ratio: 11 — ABNORMAL LOW (ref 12–20)
BUN: 8 MG/DL (ref 6–20)
Bilirubin, total: 0.4 MG/DL (ref 0.2–1.0)
CO2: 27 MMOL/L (ref 21–32)
Calcium: 9.3 MG/DL (ref 8.5–10.1)
Chloride: 105 MMOL/L (ref 97–108)
Creatinine: 0.7 MG/DL (ref 0.6–1.3)
GFR est AA: >60 mL/min/{1.73_m2} (ref >60)
GFR est non-AA: >60 mL/min/{1.73_m2} (ref >60)
Globulin: 4 g/dL (ref 2.0–4.0)
Glucose: 70 MG/DL (ref 65–100)
Potassium: 4.1 MMOL/L (ref 3.5–5.1)
Protein, total: 7.6 g/dL (ref 6.4–8.2)
Sodium: 139 MMOL/L (ref 136–145)

## 2009-01-20 LAB — MAGNESIUM: Magnesium: 2.1 MG/DL (ref 1.6–2.4)

## 2009-01-20 MED ORDER — HYDROMORPHONE 4 MG TAB
4 mg | ORAL_TABLET | ORAL | Status: DC | PRN
Start: 2009-01-20 — End: 2009-02-12

## 2009-01-20 MED ORDER — AZITHROMYCIN 500 MG IV SOLUTION
500 mg | INTRAVENOUS | Status: AC
Start: 2009-01-20 — End: 2009-01-20
  Administered 2009-01-20: 23:00:00 via INTRAVENOUS

## 2009-01-20 MED ORDER — SODIUM CHLORIDE 0.9% BOLUS IV
0.9 % | INTRAVENOUS | Status: AC
Start: 2009-01-20 — End: 2009-01-20
  Administered 2009-01-20: 23:00:00 via INTRAVENOUS

## 2009-01-20 MED ORDER — DIAZEPAM 5 MG/ML SYRINGE
5 mg/mL | INTRAMUSCULAR | Status: AC
Start: 2009-01-20 — End: 2009-01-20
  Administered 2009-01-20: via INTRAVENOUS

## 2009-01-20 MED ORDER — HYDROMORPHONE 2 MG TAB
2 mg | ORAL | Status: AC
Start: 2009-01-20 — End: 2009-01-20
  Administered 2009-01-20: via ORAL

## 2009-01-20 MED FILL — AZITHROMYCIN 500 MG IV SOLUTION: 500 mg | INTRAVENOUS | Qty: 5

## 2009-01-20 MED FILL — DIAZEPAM 5 MG/ML SYRINGE: 5 mg/mL | INTRAMUSCULAR | Qty: 2

## 2009-01-20 MED FILL — HYDROMORPHONE 2 MG TAB: 2 mg | ORAL | Qty: 1

## 2009-01-20 MED FILL — SODIUM CHLORIDE 0.9 % IV: INTRAVENOUS | Qty: 1000

## 2009-01-20 NOTE — ED Provider Notes (Signed)
HPI Comments: Pt is a 34 yo F presenting to the ED with C/O of multiple episodes of seizure-like activity x 1400. Pt's spouse notes recent increased stress.  Pt's spouse also notes during one episode pt was grabbing her throat "like she couldn't breathe or something." Pt was seen 2 days ago for same Sxs and Dx'ed with UTI, headache, acute sinusitis, and Pseudoseizure.  Spouse states pt was Rx'ed azithromycin and MACROBID but have been unable to fill the Rx secondary to monetary concerns.  Pt's spouse denies pt has been incontinent.    Hx otherwise limited secondary to pt's status. 5:24 PM  Written by [place], ED Scribe, as dictated by Neldon Newport, MD.    The history is provided by the spouse. No language interpreter was used.        Past Medical History   Diagnosis Date   ??? Arthritis    ??? Seizures    ??? Other ill-defined conditions      fibromyalgia   ??? Other ill-defined conditions      chronic ear disease    ??? Other ill-defined conditions      toxic mildew syndrome          Past Surgical History   Procedure Date   ??? Hx orthopaedic      left arm    ??? Hx heent            No family history on file.     History   Social History   ??? Marital Status: Single     Spouse Name: N/A     Number of Children: N/A   ??? Years of Education: N/A   Occupational History   ??? Not on file.   Social History Main Topics   ??? Smoking status: Current Everyday Smoker -- 0.5 packs/day   ??? Smokeless tobacco: Never Used   ??? Alcohol Use: No   ??? Drug Use: No   ??? Sexually Active: Yes -- Female partner(s)   Other Topics Concern   ??? Not on file   Social History Narrative   ??? No narrative on file           ALLERGIES: Pcn, Sulfa (sulfonamide antibiotics), Ketorolac, Ciprofloxacin, Benadryl, Darvocet a500 and Aspirin      Review of Systems   Constitutional: Negative.  Negative for fever and chills.   HENT: Negative.         See HPI   Eyes: Negative.    Respiratory: Negative.  Negative for shortness of breath.     Cardiovascular: Negative.  Negative for chest pain.   Gastrointestinal: Negative.  Negative for nausea, vomiting, abdominal pain and diarrhea.   Genitourinary:        See HPI   Musculoskeletal: Negative.    Skin: Negative.    Neurological: Positive for seizures.   Hematological: Negative.    Psychiatric/Behavioral: Negative.    All other systems reviewed and are negative.        Filed Vitals:    01/20/2009  4:50 PM 01/20/2009  5:00 PM   BP:  96/65   Pulse: 80 75   Temp: 98 ??F (36.7 ??C)    Resp: 20 20   SpO2: 100% 100%              Physical Exam   Nursing note and vitals reviewed.  Constitutional: She is oriented to person, place, and time. She appears well-developed and well-nourished. She appears distressed.   HENT:   Head: Normocephalic and  atraumatic.   Eyes: Extraocular motions are normal. Pupils are equal, round, and reactive to light.   Neck: Normal range of motion. Neck supple.   Cardiovascular: Normal rate, regular rhythm, normal heart sounds and intact distal pulses.  Exam reveals no friction rub.    No murmur heard.  Pulmonary/Chest: Effort normal and breath sounds normal. No respiratory distress. She has no wheezes. She has no rales. She exhibits no tenderness.   Abdominal: Soft. Bowel sounds are normal. She exhibits no distension. No tenderness. She has no rebound and no guarding.   Musculoskeletal: Normal range of motion. She exhibits no edema and no tenderness.   Neurological: She is alert and oriented to person, place, and time. She exhibits normal muscle tone. Coordination normal.        The patient shakes at times, but has no post-ictal phase   Skin: Skin is warm and dry. She is not diaphoretic. No pallor.   Psychiatric: She has a normal mood and affect. Her behavior is normal.        MDM Coding   Reviewed: previous chart, nursing note and vitals  Reviewed previous: labs, CT scan and x-ray  Interpretation: labs        Procedures    6:49 PM   Pt has been reexamined.  Pt had no new physical findings or complaints.  All lab results were reviewed with the patient.  All medications were reviewed with the Pt.  All of Pt's questions were answered.  Patient advised and agrees to follow up with neurologist in two days.  Patient is feeling better and is ready to go home.  Written by [place], ED Scribe, as dictated by Neldon Newport, MD.

## 2009-01-20 NOTE — ED Notes (Signed)
Patient awake, alert, urinated in bed. States she is unable to give urine sample at this time.

## 2009-01-20 NOTE — ED Notes (Signed)
I have reviewed discharge instructions with the patient.  The patient verbalized understanding.

## 2009-01-20 NOTE — ED Notes (Signed)
Patient assisted to ER room 13 by RN, stretcher. Assumed care of patient. Monitor x3 applied. VS reassessed and frequent. ED MD into bedside. Nursing assessment completed. Call bell within reach, bed low.

## 2009-01-20 NOTE — ED Notes (Signed)
Patient presents during nursing exam awake, alert, oriented x3, with intermittent episodes of seizure-like activity, jerking motions, no incontinence, arousable to painful stimulus. VS frequent, patient visible to nursing staff at all times, seizure precautions.

## 2009-01-21 LAB — CBC WITH AUTOMATED DIFF
ABS. BASOPHILS: 0.1 10*3/uL (ref 0.0–0.1)
ABS. EOSINOPHILS: 0.3 10*3/uL (ref 0.0–0.4)
ABS. LYMPHOCYTES: 6.3 10*3/uL — ABNORMAL HIGH (ref 0.8–3.5)
ABS. MONOCYTES: 1 10*3/uL (ref 0.0–1.0)
ABS. NEUTROPHILS: 6.6 10*3/uL (ref 1.8–8.0)
BASOPHILS: 1 % (ref 0–1)
EOSINOPHILS: 2 % (ref 0–7)
HCT: 45 % (ref 35.0–47.0)
HGB: 15.3 g/dL (ref 11.5–16.0)
LYMPHOCYTES: 44 % (ref 12–49)
MCH: 33.1 PG (ref 26.0–34.0)
MCHC: 34 g/dL (ref 30.0–36.5)
MCV: 97.4 FL (ref 80.0–99.0)
MONOCYTES: 7 % (ref 5–13)
NEUTROPHILS: 46 % (ref 32–75)
PLATELET: 262 10*3/uL (ref 150–400)
RBC: 4.62 M/uL (ref 3.80–5.20)
RDW: 13.5 % (ref 11.5–14.5)
WBC: 14.3 10*3/uL — ABNORMAL HIGH (ref 3.6–11.0)

## 2009-02-03 LAB — EKG, 12 LEAD, INITIAL
Atrial Rate: 73 {beats}/min
Calculated P Axis: 59 degrees
Calculated R Axis: 74 degrees
Calculated T Axis: 50 degrees
Diagnosis: NORMAL
P-R Interval: 132 ms
Q-T Interval: 400 ms
QRS Duration: 70 ms
QTC Calculation (Bezet): 440 ms
Ventricular Rate: 73 {beats}/min

## 2009-02-12 NOTE — ED Notes (Signed)
Pt sleeping and snoring loudly. Respirations even and unlabored on room air. Will continue to monitor for seizure activity.

## 2009-02-12 NOTE — ED Notes (Signed)
Pt woken up to use BSC. Pt unable to provide urine sample at this time.

## 2009-02-12 NOTE — ED Notes (Signed)
Pt began to have "another seizure" MD made aware at this time and at bedside, pt shaking in bed, pt has no post ictal state noted.  Following seizure like activity pt alert and oriented stating her head hurts.  Family remains at bedside.  Blankets remain on bed for precaution

## 2009-02-12 NOTE — ED Notes (Signed)
Blankets placed on side rails pt on seizure precautions.  Husband and son at bedside call bell within reach.  Pt having no seizure activity at this time.  Per triage pt began to [place] seize.  Pt alert and oriented at this time

## 2009-02-12 NOTE — ED Notes (Signed)
Assumed care of pt. Pt's husband report pt stated she was going to have a seizure. Pt found shaking on bed and when she opened her eyes reported she has head pain. Will notify Dr. Hinton Rao.

## 2009-02-12 NOTE — ED Notes (Signed)
Pt called out at this time to state that her head hurts, MD made aware

## 2009-02-12 NOTE — ED Provider Notes (Signed)
HPI Comments: 34 year old female with known pmhx of seizures and pseudoseizures presents to ER with an isolated generalized tonic-clonic whole body seizure this evening just prior to arrival.  Hr husband witnessed the seizure and stated that it lasted a few minutes and then resolved.  He states that she has had some increased stress as of recent, but states that she takes her medication like she is suppose to.  She is follow by Dr. Jerrilyn Cairo.      There was no loss of bowel or bladder function and she did not bite her tongue.  She denies any other injury.  She states that she has a headache at time, but that she ran out of her dilaudid a few days ago.    Smoke: yes  Drink: no  Drug: no    Occupation: disability    Patient is a 34 y.o. female presenting with seizures.   Seizure   Associated symptoms include headaches. Pertinent negatives include no speech difficulty, no visual disturbance, no chest pain, no cough, no nausea, no vomiting and no diarrhea.   She reports headaches.  She reports no chest pain, no visual disturbance, no diarrhea, no vomiting, no speech difficulty and no cough.        Past Medical History   Diagnosis Date   ??? Arthritis    ??? Seizures    ??? Other ill-defined conditions      fibromyalgia   ??? Other ill-defined conditions      chronic ear disease    ??? Other ill-defined conditions      toxic mildew syndrome          Past Surgical History   Procedure Date   ??? Hx orthopaedic      left arm    ??? Hx heent            No family history on file.     History   Social History   ??? Marital Status: Single     Spouse Name: N/A     Number of Children: N/A   ??? Years of Education: N/A   Occupational History   ??? Not on file.   Social History Main Topics   ??? Smoking status: Current Everyday Smoker -- 0.5 packs/day   ??? Smokeless tobacco: Never Used   ??? Alcohol Use: No   ??? Drug Use: No   ??? Sexually Active: Yes -- Female partner(s)   Other Topics Concern   ??? Not on file   Social History Narrative    ??? No narrative on file           ALLERGIES: Pcn, Sulfa (sulfonamide antibiotics), Ketorolac, Ciprofloxacin, Benadryl, Darvocet a500 and Aspirin      Review of Systems   Constitutional: Negative.  Negative for fever, chills, activity change, appetite change, fatigue and unexpected weight change.   HENT: Negative for hearing loss, congestion, rhinorrhea, sneezing, neck stiffness and voice change.    Eyes: Negative.  Negative for pain and visual disturbance.   Respiratory: Negative.  Negative for apnea, cough, choking, chest tightness and shortness of breath.    Cardiovascular: Negative.  Negative for chest pain and palpitations.   Gastrointestinal: Negative.  Negative for nausea, vomiting, abdominal pain, diarrhea, blood in stool and abdominal distention.   Genitourinary: Negative.  Negative for urgency, frequency, flank pain and difficulty urinating.        No discharge   Musculoskeletal: Negative.  Negative for myalgias, back pain and arthralgias.  Skin: Negative.  Negative for rash and color change.   Neurological: Positive for dizziness, seizures and headaches. Negative for syncope, speech difficulty, weakness and numbness.   Hematological: Negative.  Negative for adenopathy.   Psychiatric/Behavioral: Negative.  Negative for suicidal ideas, behavioral problems, dysphoric mood and agitation. The patient is not nervous/anxious.        Filed Vitals:    02/12/2009  9:15 PM   BP: 121/83   Pulse: 76   Temp: 97.6 ??F (36.4 ??C)   Resp: 24   Height: 5\' 4"  (1.626 m)   Weight: 128 lb 4.9 oz (58.2 kg)   SpO2: 98%              Physical Exam   Nursing note and vitals reviewed.  Constitutional: She is oriented to person, place, and time. She appears well-developed and well-nourished. No distress.        Appears tearful   HENT:   Head: Normocephalic and atraumatic.   Mouth/Throat: Oropharynx is clear and moist. No oropharyngeal exudate.    Eyes: Conjunctivae and extraocular motions are normal. Pupils are equal, round, and reactive to light. Right eye exhibits no discharge. Left eye exhibits no discharge.   Neck: Normal range of motion. Neck supple.   Cardiovascular: Normal rate, regular rhythm and intact distal pulses.  Exam reveals no gallop and no friction rub.    No murmur heard.  Pulmonary/Chest: Effort normal and breath sounds normal. No respiratory distress. She has no wheezes. She has no rales. She exhibits no tenderness.   Abdominal: Soft. Bowel sounds are normal. She exhibits no distension and no mass. No tenderness. She has no rebound and no guarding.   Musculoskeletal: Normal range of motion. She exhibits no edema.   Lymphadenopathy:     She has no cervical adenopathy.   Neurological: She is alert and oriented to person, place, and time. She has normal reflexes. No cranial nerve deficit. Coordination normal.        Two witnessed seizures in ER with no post ictal period.  Patient is alert during seizure. They last 10-15 seconds then resolve.  Patient has voluntary movements during her seizures.   Skin: Skin is warm and dry. No rash noted. No erythema.   Psychiatric: She has a normal mood and affect.        Coding    Procedures  11:45 AM  Results have been reviewed with the patient and/or family and their questions have been answered.  We will continue to review further results as they come available.  I have reviewed the patients vitals signs and determined there is currently no worsening in the patients condition or physical exam.    12:04 AM  I reviewed our electronic medical record system for any past medical records that were available that may contribute to the patients current condition, the nursing notes and and vital signs from today's visit.    12:05 AM   The patient's results have been reviewed with them.  Patient and/or family,and they have verbally conveyed their understanding and agreement of the patient's signs, symptoms, diagnosis, treatment and prognosis and additionally agree to follow up as recommended or return to the Emergency Room should their condition change prior to their follow-up appointment. The patient verbally agrees with the care-plan and verbally conveys that all of their questions have been answered.   The discharge instructions have also been provided to the patient with some educational information regarding their diagnosis as well a list of reasons why  they would want to return to the ER prior to their follow-up appointment should their condition change.      2:22 AM  Dr. Daphine Deutscher had re-evaluated Shannon [name]. Results of labs have been discussed.  Pt understands dx of UTI and pseudo seizures and agrees to have further outpatient workup done as needed. Pt agrees to return to ED upon further deterioration. Pt is ready to go home.   Written by Dinah Beers. Moorman, ED Scribe, as dictated by Dr. Daphine Deutscher.

## 2009-02-13 LAB — METABOLIC PANEL, COMPREHENSIVE
A-G Ratio: 0.9 — ABNORMAL LOW (ref 1.1–2.2)
ALT (SGPT): 31 U/L (ref 12–78)
AST (SGOT): 15 U/L (ref 15–37)
Albumin: 4 g/dL (ref 3.5–5.0)
Alk. phosphatase: 96 U/L (ref 50–136)
Anion gap: 11 mmol/L (ref 5–15)
BUN/Creatinine ratio: 12 (ref 12–20)
BUN: 11 MG/DL (ref 6–20)
Bilirubin, total: 0.4 MG/DL (ref 0.2–1.0)
CO2: 24 MMOL/L (ref 21–32)
Calcium: 9.4 MG/DL (ref 8.5–10.1)
Chloride: 103 MMOL/L (ref 97–108)
Creatinine: 0.9 MG/DL (ref 0.6–1.3)
GFR est AA: >60 mL/min/{1.73_m2} (ref >60)
GFR est non-AA: >60 mL/min/{1.73_m2} (ref >60)
Globulin: 4.3 g/dL — ABNORMAL HIGH (ref 2.0–4.0)
Glucose: 127 MG/DL — ABNORMAL HIGH (ref 65–100)
Potassium: 3.7 MMOL/L (ref 3.5–5.1)
Protein, total: 8.3 g/dL — ABNORMAL HIGH (ref 6.4–8.2)
Sodium: 138 MMOL/L (ref 136–145)

## 2009-02-13 LAB — CBC WITH AUTOMATED DIFF
ABS. BASOPHILS: 0.1 10*3/uL (ref 0.0–0.1)
ABS. EOSINOPHILS: 0.1 10*3/uL (ref 0.0–0.4)
ABS. LYMPHOCYTES: 5.6 10*3/uL — ABNORMAL HIGH (ref 0.8–3.5)
ABS. MONOCYTES: 1.1 10*3/uL — ABNORMAL HIGH (ref 0.0–1.0)
ABS. NEUTROPHILS: 5.6 10*3/uL (ref 1.8–8.0)
BASOPHILS: 1 % (ref 0–1)
EOSINOPHILS: 1 % (ref 0–7)
HCT: 43.8 % (ref 35.0–47.0)
HGB: 15.1 g/dL (ref 11.5–16.0)
LYMPHOCYTES: 45 % (ref 12–49)
MCH: 33 PG (ref 26.0–34.0)
MCHC: 34.5 g/dL (ref 30.0–36.5)
MCV: 95.8 FL (ref 80.0–99.0)
MONOCYTES: 9 % (ref 5–13)
NEUTROPHILS: 44 % (ref 32–75)
PLATELET: 262 10*3/uL (ref 150–400)
RBC: 4.57 M/uL (ref 3.80–5.20)
RDW: 13.3 % (ref 11.5–14.5)
WBC: 12.5 10*3/uL — ABNORMAL HIGH (ref 3.6–11.0)

## 2009-02-13 LAB — HCG URINE, QL: HCG urine, QL: NEGATIVE

## 2009-02-13 LAB — URINALYSIS W/ REFLEX CULTURE
Blood: NEGATIVE
Glucose: NEGATIVE MG/DL
Nitrites: NEGATIVE
Specific gravity: 1.029 (ref 1.003–1.030)
Urobilinogen: 1 EU/DL (ref 0.2–1.0)
pH (UA): 6 (ref 5.0–8.0)

## 2009-02-13 LAB — DRUG SCREEN, URINE
AMPHETAMINES: NEGATIVE
BARBITURATES: NEGATIVE
BENZODIAZEPINES: POSITIVE — AB
COCAINE: NEGATIVE
OPIATES: POSITIVE — AB
PCP(PHENCYCLIDINE): NEGATIVE
THC (TH-CANNABINOL): POSITIVE — AB
TRICYCLICS: NEGATIVE

## 2009-02-13 LAB — BILIRUBIN, CONFIRM: Bilirubin UA, confirm: NEGATIVE

## 2009-02-13 MED ORDER — SODIUM CHLORIDE 0.9% BOLUS IV
0.9 % | INTRAVENOUS | Status: AC
Start: 2009-02-13 — End: 2009-02-12
  Administered 2009-02-13: 03:00:00 via INTRAVENOUS

## 2009-02-13 MED ORDER — SODIUM CHLORIDE 0.9 % IJ SYRG
INTRAMUSCULAR | Status: DC
Start: 2009-02-13 — End: 2009-02-13

## 2009-02-13 MED ORDER — CEPHALEXIN 500 MG CAP
500 mg | ORAL_CAPSULE | Freq: Four times a day (QID) | ORAL | Status: AC
Start: 2009-02-13 — End: 2009-02-20

## 2009-02-13 MED ORDER — LORAZEPAM 2 MG/ML IJ SOLN
2 mg/mL | INTRAMUSCULAR | Status: DC
Start: 2009-02-13 — End: 2009-02-13

## 2009-02-13 MED ORDER — LORAZEPAM 2 MG/ML IJ SOLN
2 mg/mL | INTRAMUSCULAR | Status: AC
Start: 2009-02-13 — End: 2009-02-12

## 2009-02-13 MED ORDER — LORAZEPAM 2 MG/ML IJ SOLN
2 mg/mL | INTRAMUSCULAR | Status: AC
Start: 2009-02-13 — End: 2009-02-12
  Administered 2009-02-13: 02:00:00

## 2009-02-13 MED FILL — SALINE FLUSH INJECTION SYRINGE: INTRAMUSCULAR | Qty: 10

## 2009-02-13 MED FILL — LORAZEPAM 2 MG/ML IJ SOLN: 2 mg/mL | INTRAMUSCULAR | Qty: 1

## 2009-02-13 MED FILL — SODIUM CHLORIDE 0.9 % IV: INTRAVENOUS | Qty: 1000

## 2009-02-13 MED FILL — SALINE FLUSH INJECTION SYRINGE: INTRAMUSCULAR | Qty: 30

## 2009-02-13 NOTE — ED Notes (Signed)
Pt given PO fluids per Dr. Hinton Rao. Pt still unable to urinate.

## 2009-02-13 NOTE — ED Notes (Signed)
Dr. Martin at bedside to speak with pt about discharge.

## 2009-02-13 NOTE — ED Notes (Signed)
Pt sleeping and snoring.

## 2009-02-13 NOTE — ED Notes (Signed)
Pt assisted to [facility] to provide urine sample.

## 2009-02-13 NOTE — ED Notes (Signed)
Pt resting in bed. No seizure activity noted. Pt awaiting lab results. A&OX3, respirations even and unlabored on room air.

## 2009-02-13 NOTE — ED Notes (Signed)
Pt up to [facility] with assistance from husband to provide urine sample.

## 2009-02-14 LAB — CULTURE, URINE
Colonies Counted: 25000
Colony Count: 25000

## 2009-04-03 NOTE — ED Notes (Signed)
Pt in from lobby with c/o seizures. Upon walking in room pt noted to be shaking. Family sitting to bedside. Pt localized to pain upon preforming sternal rub. Pt then opens eyes, looked around and then began to shake again. Shortly after pt then tearful. Per family pt seen neurologist and meds increased. Pt on monitor. Awaiting Md eval.

## 2009-04-03 NOTE — ED Provider Notes (Addendum)
HPI   34 year old female presents ambulatory to [facility] ED with cc of seizure-like activity PTA. Paperwork shows pt has hx of pseudo-seizures and is currently on seizure medications. Pt complains of a worsening HA x today. Pt states she took hydrocodone and percocet with no relief. Pt reports hx of migraines and HA feels similar. Pt denies CP, SOB, ABD pain, NVD, f/c, cough or rash.  Neurology: Dr. Jettie Pagan    11:30 PM  There are no other complaints, changes or physical findings at this time.  Written by Jeronimo Greaves, ED Scribe; dictated by Barnie Del, MD    Past Medical History   Diagnosis Date   ??? Arthritis    ??? Seizures    ??? Other ill-defined conditions      fibromyalgia   ??? Other ill-defined conditions      chronic ear disease    ??? Other ill-defined conditions      toxic mildew syndrome          Past Surgical History   Procedure Date   ??? Hx orthopaedic      left arm    ??? Hx heent            No family history on file.     History   Social History   ??? Marital Status: Single     Spouse Name: N/A     Number of Children: N/A   ??? Years of Education: N/A   Occupational History   ??? Not on file.   Social History Main Topics   ??? Smoking status: Current Everyday Smoker -- 0.5 packs/day   ??? Smokeless tobacco: Not on file   ??? Alcohol Use: No   ??? Drug Use: No   ??? Sexually Active: Yes -- Female partner(s)   Other Topics Concern   ??? Not on file   Social History Narrative   ??? No narrative on file           ALLERGIES: Pcn, Sulfa (sulfonamide antibiotics), Ketorolac, Ciprofloxacin, Benadryl, Darvocet a500 and Aspirin      Review of Systems   Constitutional: Negative.  Negative for fever and chills.   Eyes: Negative.    Respiratory: Negative.  Negative for cough and shortness of breath.    Cardiovascular: Negative.  Negative for chest pain.   Gastrointestinal: Negative.  Negative for nausea, vomiting, abdominal pain and diarrhea.   Genitourinary: Negative.    Musculoskeletal: Negative.    Skin: Negative.  Negative for rash.    Neurological: Positive for headaches.   Hematological: Negative.    Psychiatric/Behavioral: Negative.    All other systems reviewed and are negative.        Filed Vitals:    04/03/09 2116 04/03/09 2245 04/04/09 0001   BP: 103/83 101/65 101/67   Pulse: 96     Temp: 97.6 ??F (36.4 ??C)     Resp: 18     Height: 5' 4.5" (1.638 Dhyana Bastone)     Weight: 136 lb (61.689 kg)     SpO2: 97% 100% 99%                Physical Exam   Nursing note and vitals reviewed.  Constitutional: She is oriented to person, place, and time. She appears well-developed and well-nourished. No distress.   HENT:   Head: Normocephalic and atraumatic.   Eyes: Conjunctivae and extraocular motions are normal. Pupils are equal, round, and reactive to light.   Neck: Normal range of motion. Neck supple.  Cardiovascular: Normal rate, regular rhythm and intact distal pulses.    No murmur heard.  Pulmonary/Chest: Effort normal and breath sounds normal. No respiratory distress. She has no wheezes.   Abdominal: Soft. Bowel sounds are normal. She exhibits no distension. No tenderness. She has no rebound.   Musculoskeletal: Normal range of motion. She exhibits no edema and no tenderness.   Neurological: She is alert and oriented to person, place, and time. She has normal strength. No cranial nerve deficit or sensory deficit. She exhibits normal muscle tone.        Lying on bed with muffled speech, poor effort during neuro exam   Skin: Skin is warm and dry.   Psychiatric: She has a normal mood and affect. Her behavior is normal. Her speech is delayed.        MDM Coding   Reviewed: previous chart, nursing note and vitals  Interpretation: labs        Procedures    12:29 AM     I have reviewed all pertinent and currently available diagnostic test results for this visit including, but not limited to, labs, xrays, and EKGs. I have reviewed all pertinent and currently available medical records. My plan of care and further evaluation and/or disposition is based on these results, as well as the initial, and subsequent, history and physical exam, as well as any additional complaints during the visit.    Toney Rakes, MD     MEDICATIONS GIVEN:    Current facility-administered medications   Medication Dose Route Frequency   ??? acetaminophen (TYLENOL) tablet 1,000 mg  1,000 mg Oral NOW   Current outpatient prescriptions   Medication Sig   ??? nitrofurantoin, macrocrystal-monohydrate, (MACROBID) 100 mg capsule Take 1 Cap by mouth two (2) times a day.   ??? butalbital-acetaminophen-caffeine (FIORICET) 50-325-40 mg per tablet Take 2 Tabs by mouth every six (6) hours as needed for Headache for 20 doses. Maximum dose 6 capsules daily   ??? citalopram (CELEXA) 40 mg tablet Take 40 mg by mouth daily.   ??? diazepam (VALIUM) 5 mg tablet Take 1 Tab by mouth three (3) times daily as needed for Anxiety (spasm).   ??? citalopram (CELEXA) 40 mg tablet Take 40 mg by mouth daily. 2 tabs daily    ??? topiramate (TOPAMAX) 200 mg tablet Take 200 mg by mouth two (2) times a day.   ??? diazepam (VALIUM) 5 mg tablet Take 5 mg by mouth every six (6) hours as needed. Indications: ANXIETY   ??? olanzapine (ZYPREXA) 15 mg tablet Take 15 mg by mouth nightly.   ??? carbamazepine (TEGRETOL) 200 mg tablet Take 200 mg by mouth three (3) times daily.         LAB RESULTS:    Recent Results (from the past 12 hour(s))   GLUCOSE, POC    Collection Time    04/03/09  9:30 PM   Component Value Range   ??? POC GLUCOSE 97  65 - 105 (mg/dL)   CBC WITH AUTOMATED DIFF    Collection Time    04/03/09 11:30 PM   Component Value Range   ??? WBC 13.3 (*) 3.6 - 11.0 (K/uL)   ??? RBC 4.87  3.80 - 5.20 ([place]/uL)   ??? HGB 16.3 (*) 11.5 - 16.0 (g/dL)    ??? HCT 46.6  35.0 - 47.0 (%)   ??? MCV 95.7  80.0 - 99.0 (FL)   ??? MCH 33.5  26.0 - 34.0 (PG)   ??? MCHC 35.0  30.0 - 36.5 (g/dL)   ??? RDW 13.3  11.5 - 14.5 (%)   ??? PLATELET 247  150 - 400 (K/uL)   ??? NEUTROPHILS PENDING  (%)   ??? LYMPHOCYTES PENDING  (%)   ??? MONOCYTES PENDING  (%)   ??? EOSINOPHILS PENDING  (%)   ??? BASOPHILS PENDING  (%)   ??? ABSOLUTE NEUTS PENDING  (K/UL)   ??? ABSOLUTE LYMPHS PENDING  (K/UL)   ??? ABSOLUTE MONOS PENDING  (K/UL)   ??? ABSOLUTE EOSINS PENDING  (K/UL)   ??? ABSOLUTE BASOS PENDING  (K/UL)   ??? DF PENDING     URINALYSIS W/ REFLEX CULTURE    Collection Time    04/04/09 12:00 AM   Component Value Range   ??? Color YELLOW     ??? Appearance CLOUDY     ??? Specific gravity 1.015  1.003 - 1.030 ( )   ??? pH 7.0  5.0 - 8.0 ( )   ??? Protein NEGATIVE   NEGATIVE (MG/DL)   ??? Glucose NEGATIVE   NEGATIVE (MG/DL)   ??? Ketone NEGATIVE   NEGATIVE (MG/DL)   ??? Bilirubin NEGATIVE   NEGATIVE    ??? Blood NEGATIVE   NEGATIVE    ??? Urobilinogen 1.0  0.2 - 1.0 (EU/DL)   ??? Nitrites NEGATIVE   NEGATIVE    ??? Leukocyte Esterase LARGE (*) NEGATIVE    ??? WBC PENDING  (/HPF)   ??? RBC PENDING  (/HPF)   ??? Epithelial cells PENDING  (/LPF)   ??? Bacteria PENDING  (/HPF)   ??? UA:UC IF INDICATED PENDING             DIAGNOSIS:    1. UTI (urinary tract infection) (599.0C)    2. 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  Pt is feeling better.  Findings, results, and medications were reviewed with the patient.  Pt advised to follow up with her PCP for further evaluation and Tx.  Pt's questions were answered and she agrees with Tx, D/C, and follow up plans.  Pt advised to return to the ED for worsening Sx.  Pt is ready to be D/C home.  Written by Rondall Allegra, ED Scribe, as dictated by Jana Hakim, MD.

## 2009-04-03 NOTE — ED Notes (Signed)
Pt has shaking episode in triage and would not answer questions. Pt immediately A&Ox3 after shaking episode and able to answer questions logically. Pt did not bit tongue or lose bowel or bladder control.

## 2009-04-03 NOTE — ED Notes (Signed)
Pt's blood sugar checked 97mg /dl

## 2009-04-04 LAB — CBC WITH AUTOMATED DIFF
ABS. BASOPHILS: 0 10*3/uL (ref 0.0–0.1)
ABS. EOSINOPHILS: 0.3 10*3/uL (ref 0.0–0.4)
ABS. LYMPHOCYTES: 6.5 10*3/uL — ABNORMAL HIGH (ref 0.8–3.5)
ABS. MONOCYTES: 0.9 10*3/uL (ref 0.0–1.0)
ABS. NEUTROPHILS: 5.6 10*3/uL (ref 1.8–8.0)
BASOPHILS: 0 % (ref 0–1)
EOSINOPHILS: 2 % (ref 0–7)
HCT: 46.6 % (ref 35.0–47.0)
HGB: 16.3 g/dL — ABNORMAL HIGH (ref 11.5–16.0)
LYMPHOCYTES: 49 % (ref 12–49)
MCH: 33.5 PG (ref 26.0–34.0)
MCHC: 35 g/dL (ref 30.0–36.5)
MCV: 95.7 FL (ref 80.0–99.0)
MONOCYTES: 7 % (ref 5–13)
NEUTROPHILS: 42 % (ref 32–75)
PLATELET: 247 10*3/uL (ref 150–400)
RBC: 4.87 M/uL (ref 3.80–5.20)
RDW: 13.3 % (ref 11.5–14.5)
WBC: 13.3 10*3/uL — ABNORMAL HIGH (ref 3.6–11.0)

## 2009-04-04 LAB — URINALYSIS W/ REFLEX CULTURE
Bilirubin: NEGATIVE
Blood: NEGATIVE
Glucose: NEGATIVE MG/DL
Ketone: NEGATIVE MG/DL
Nitrites: NEGATIVE
Protein: NEGATIVE MG/DL
Specific gravity: 1.015 (ref 1.003–1.030)
Urobilinogen: 1 EU/DL (ref 0.2–1.0)
pH (UA): 7 (ref 5.0–8.0)

## 2009-04-04 LAB — METABOLIC PANEL, COMPREHENSIVE
A-G Ratio: 1 — ABNORMAL LOW (ref 1.1–2.2)
ALT (SGPT): 30 U/L (ref 12–78)
AST (SGOT): 17 U/L (ref 15–37)
Albumin: 4 g/dL (ref 3.5–5.0)
Alk. phosphatase: 81 U/L (ref 50–136)
Anion gap: 13 mmol/L (ref 5–15)
BUN/Creatinine ratio: 11 — ABNORMAL LOW (ref 12–20)
BUN: 8 MG/DL (ref 6–20)
Bilirubin, total: 0.5 MG/DL (ref 0.2–1.0)
CO2: 24 MMOL/L (ref 21–32)
Calcium: 10 MG/DL (ref 8.5–10.1)
Chloride: 102 MMOL/L (ref 97–108)
Creatinine: 0.7 MG/DL (ref 0.6–1.3)
GFR est AA: >60 mL/min/{1.73_m2} (ref >60)
GFR est non-AA: >60 mL/min/{1.73_m2} (ref >60)
Globulin: 4 g/dL (ref 2.0–4.0)
Glucose: 88 MG/DL (ref 65–100)
Potassium: 4.2 MMOL/L (ref 3.5–5.1)
Protein, total: 8 g/dL (ref 6.4–8.2)
Sodium: 139 MMOL/L (ref 136–145)

## 2009-04-04 LAB — HCG URINE, QL: HCG urine, QL: NEGATIVE

## 2009-04-04 LAB — GLUCOSE, POC: Glucose (POC): 97 mg/dL (ref 65–105)

## 2009-04-04 LAB — CARBAMAZEPINE: Carbamazepine: <0.5 ug/mL — ABNORMAL LOW (ref 4.0–12.0)

## 2009-04-04 MED ORDER — OXYCODONE-ACETAMINOPHEN 5 MG-325 MG TAB
5-325 mg | ORAL | Status: AC
Start: 2009-04-04 — End: 2009-04-04
  Administered 2009-04-04: 05:00:00 via ORAL

## 2009-04-04 MED ORDER — NITROFURANTOIN MACROCRYSTAL 50 MG CAP
50 mg | ORAL | Status: AC
Start: 2009-04-04 — End: 2009-04-04
  Administered 2009-04-04: 05:00:00 via ORAL

## 2009-04-04 MED ORDER — ACETAMINOPHEN 500 MG TAB
500 mg | ORAL | Status: AC
Start: 2009-04-04 — End: 2009-04-03
  Administered 2009-04-04: 03:00:00 via ORAL

## 2009-04-04 MED ORDER — NITROFURANTOIN (25% MACROCRYSTAL FORM) 100 MG CAP
100 mg | ORAL_CAPSULE | Freq: Two times a day (BID) | ORAL | Status: DC
Start: 2009-04-04 — End: 2009-05-06

## 2009-04-04 MED FILL — ACETAMINOPHEN 500 MG TAB: 500 mg | ORAL | Qty: 2

## 2009-04-04 MED FILL — NITROFURANTOIN MACROCRYSTAL 50 MG CAP: 50 mg | ORAL | Qty: 2

## 2009-04-04 MED FILL — OXYCODONE-ACETAMINOPHEN 5 MG-325 MG TAB: 5-325 mg | ORAL | Qty: 1

## 2009-04-04 NOTE — ED Notes (Signed)
Pt discharged to home at this time with self and husband. Pt provided with written instructions and prescriptions. All questions answered.

## 2009-04-04 NOTE — ED Notes (Signed)
Pt denies pain relief from tylenol. States that she usually have to get Dilaudid to relieve pain. Will notify MD.

## 2009-04-05 LAB — CULTURE, URINE
Colonies Counted: 55000
Colony Count: 55000

## 2009-04-28 NOTE — ED Provider Notes (Signed)
HPI Comments: 34 y.o. female presents via EMS to ED with c/o L lateral ankle pain after twisting it yesterday while moving. Pt notes HA. Pt reports taking Percocet 10 mg and hydrocodone 10 mg with no relief. Pt admits she did not have a seizure, but had a stress reaction. Pt reports h/o "pseudo seizures" that are stress related.     Pt has no further sx or complaints at this time.   Written by Glendale Chard, ED Scribe, as dictated by Coralie Common, MD  at 10:51 PM.          The history is provided by the patient.        Past Medical History   Diagnosis Date   ??? Arthritis    ??? Seizures    ??? Other ill-defined conditions      fibromyalgia   ??? Other ill-defined conditions      chronic ear disease    ??? Other ill-defined conditions      toxic mildew syndrome          Past Surgical History   Procedure Date   ??? Hx orthopaedic      left arm    ??? Hx heent            No family history on file.     History   Social History   ??? Marital Status: Single     Spouse Name: N/A     Number of Children: N/A   ??? Years of Education: N/A   Occupational History   ??? Not on file.   Social History Main Topics   ??? Smoking status: Current Everyday Smoker -- 0.5 packs/day   ??? Smokeless tobacco: Not on file   ??? Alcohol Use: No   ??? Drug Use: No   ??? Sexually Active: Yes -- Female partner(s)   Other Topics Concern   ??? Not on file   Social History Narrative   ??? No narrative on file           ALLERGIES: Pcn, Sulfa (sulfonamide antibiotics), Ketorolac, Ciprofloxacin, Benadryl, Darvocet a500 and Aspirin      Review of Systems   Constitutional: Negative.    Eyes: Negative.    Respiratory: Negative.    Cardiovascular: Negative.    Gastrointestinal: Negative.    Genitourinary: Negative.    Musculoskeletal: Positive for arthralgias.   Skin: Negative.    Neurological: Positive for headaches. Negative for seizures.   All other systems reviewed and are negative.        Filed Vitals:    04/28/09 2243 04/28/09 2300 04/28/09 2330   BP: 122/74 100/63 111/69    Pulse: 84 77 83   Temp: 98.6 ??F (37 ??C)     Resp: 16 16 18    Height: 5\' 4"  (1.626 m)     Weight: 135 lb (61.236 kg)     SpO2: 100% 99% 98%                Physical Exam   Nursing note and vitals reviewed.  Constitutional: She is oriented to person, place, and time. She appears well-developed and well-nourished.   HENT:   Head: Normocephalic and atraumatic.   Nose: Nose normal.   Mouth/Throat: Oropharynx is clear and moist. No oropharyngeal exudate.   Eyes: Conjunctivae are normal. Right eye exhibits no discharge. Left eye exhibits no discharge. No scleral icterus.   Neck: Normal range of motion. Neck supple. No JVD present. No tracheal deviation present.   Cardiovascular:  Normal rate, regular rhythm, normal heart sounds and intact distal pulses.  Exam reveals no gallop and no friction rub.    No murmur heard.  Pulmonary/Chest: Effort normal and breath sounds normal. No stridor. No respiratory distress. She has no wheezes. She has no rales. She exhibits no tenderness.   Abdominal: Soft. Bowel sounds are normal. She exhibits no distension and no mass. No tenderness. She has no rebound and no guarding.   Musculoskeletal: Normal range of motion. She exhibits tenderness (L ankle tenderness). She exhibits no edema.   Lymphadenopathy:     She has no cervical adenopathy.   Neurological: She is alert and oriented to person, place, and time. She has normal strength. No cranial nerve deficit or sensory deficit. Coordination normal. GCS eye subscore is 4. GCS verbal subscore is 5. GCS motor subscore is 6.   Skin: Skin is warm and dry. No rash noted. No erythema. No pallor.   Psychiatric: She has a normal mood and affect. Her behavior is normal. Judgment and thought content normal.        MDM     Amount and/or Complexity of Data Reviewed:    Review and summarize past medical records:  Yes      Procedures     MDM:  Pt is requesting dilaudid for ankle pain from a sprain.  Will rule out fracture.  I will treat her with oxycodone, but I do not feel dilaudid is warranted.  She is non-toxic appearing and has "stress reaction" which she readily admits is not a seizure.  She is not post-ictal at this time.    11:50 PM  Pt is resting comfortably and feels better. Pt agrees to f/u with orthopedist in 3 days if still having pain. Results have been reviewed with patient. Pt expresses understanding and agreement.   Written by Glendale Chard, ED Scribe, as dictated by Coralie Common, MD .     11:51 PM  Patient's results have been reviewed with them.  Patient and/or family have verbally conveyed their understanding and agreement of the patient's signs, symptoms, diagnosis, treatment and prognosis and additionally agree to follow up as recommended or return to the Emergency Room should their condition change prior to follow-up.  Discharge instructions have also been provided to the patient with some educational information regarding their diagnosis as well a list of reasons why they would want to return to the ER prior to their follow-up appointment should their condition change.

## 2009-04-28 NOTE — ED Notes (Signed)
Pt sleeping. Family at bedside. Will continue to monitor.

## 2009-04-28 NOTE — ED Notes (Signed)
Pt crying continues to state nothing works except Dilaudid.

## 2009-04-28 NOTE — ED Notes (Signed)
Pt requesting Dilaudid for pain in foot and head. Dr Kathreen Cosier at bedside. Pt being rude now

## 2009-04-28 NOTE — ED Notes (Signed)
Dr. Kathreen Cosier discussing plan of care.

## 2009-04-29 MED ORDER — BUTALBITAL-ACETAMINOPHEN-CAFFEINE 50 MG-325 MG-40 MG TAB
50-325-40 mg | ORAL | Status: AC
Start: 2009-04-29 — End: 2009-04-28
  Administered 2009-04-29: 03:00:00 via ORAL

## 2009-04-29 MED ORDER — OXYCODONE 5 MG TAB
5 mg | ORAL | Status: AC
Start: 2009-04-29 — End: 2009-04-28
  Administered 2009-04-29: 03:00:00 via ORAL

## 2009-04-29 MED ORDER — SODIUM CHLORIDE 0.9 % IJ SYRG
INTRAMUSCULAR | Status: DC
Start: 2009-04-29 — End: 2009-04-29

## 2009-04-29 MED ORDER — PROCHLORPERAZINE EDISYLATE 5 MG/ML INJECTION
5 mg/mL | INTRAMUSCULAR | Status: AC
Start: 2009-04-29 — End: 2009-04-28
  Administered 2009-04-29: 03:00:00 via INTRAVENOUS

## 2009-04-29 MED FILL — BUTALBITAL-ACETAMINOPHEN-CAFFEINE 50 MG-325 MG-40 MG TAB: 50-325-40 mg | ORAL | Qty: 2

## 2009-04-29 MED FILL — PROCHLORPERAZINE EDISYLATE 5 MG/ML INJECTION: 5 mg/mL | INTRAMUSCULAR | Qty: 2

## 2009-04-29 MED FILL — OXYCODONE 5 MG TAB: 5 mg | ORAL | Qty: 2

## 2009-04-29 MED FILL — SALINE FLUSH INJECTION SYRINGE: INTRAMUSCULAR | Qty: 40

## 2009-04-29 NOTE — ED Notes (Signed)
I have reviewed discharge instructions with the patient and friend  The patient and friend verbalized understanding. Home with friend via wheelchair

## 2009-05-06 MED ORDER — DIAZEPAM 5 MG/ML SYRINGE
5 mg/mL | INTRAMUSCULAR | Status: AC
Start: 2009-05-06 — End: 2009-05-06
  Administered 2009-05-07: via INTRAVENOUS

## 2009-05-06 MED ORDER — SODIUM CHLORIDE 0.9 % IJ SYRG
INTRAMUSCULAR | Status: AC
Start: 2009-05-06 — End: 2009-05-06
  Administered 2009-05-07

## 2009-05-06 MED FILL — SALINE FLUSH INJECTION SYRINGE: INTRAMUSCULAR | Qty: 10

## 2009-05-06 NOTE — Progress Notes (Signed)
Written instructions given, verbalizes understanding. Discharged stable.

## 2009-05-06 NOTE — ED Notes (Signed)
Pt stated had metalic taste in mouth,, which is her aura pre seizure, asked Jusdon, PCT to stay with pt while this nurse pulled valium IV, pt did seize full body for less than 30 seconds. IV valium administered. Dr. Littie Deeds notified of same, no further orders at this time.

## 2009-05-06 NOTE — ED Provider Notes (Addendum)
HPI Comments: 34 y.o. female presents via EMS to ED for evaluation after seizures.  EMS states they witnessed pt have three seizures en route, lasting 2 minutes, 30 seconds, and 1 minute respectively.  Pt reports she takes Topomax (200mg  daily) and Trileptal (300mg  daily) for her seizures, and has been taking medication as directed.  Pt reports increased pain to L ankle, noting she was recently dx with a sprain to area, and tripped over her cat earlier today, re-injuring herself.  Pt denies F/C, CP, SOB, HA, N/V/D, or urinary sx's.      There are no other sx???s or complaints noted at this time.   Written by Warnell Forester, ED Scribe, as dictated by Estrella Deeds, MD.     The history is provided by the patient.        Past Medical History   Diagnosis Date   ??? Arthritis    ??? Seizures    ??? Other ill-defined conditions      fibromyalgia   ??? Other ill-defined conditions      chronic ear disease    ??? Other ill-defined conditions      toxic mildew syndrome          Past Surgical History   Procedure Date   ??? Hx orthopaedic      left arm    ??? Hx heent            No family history on file.     History   Social History   ??? Marital Status: Single     Spouse Name: N/A     Number of Children: N/A   ??? Years of Education: N/A   Occupational History   ??? Not on file.   Social History Main Topics   ??? Smoking status: Current Everyday Smoker -- 0.5 packs/day   ??? Smokeless tobacco: Not on file   ??? Alcohol Use: No   ??? Drug Use: No   ??? Sexually Active: Yes -- Female partner(s)   Other Topics Concern   ??? Not on file   Social History Narrative   ??? No narrative on file           ALLERGIES: Pcn, Sulfa (sulfonamide antibiotics), Ketorolac, Ciprofloxacin, Benadryl, Darvocet a500 and Aspirin      Review of Systems   Constitutional: Negative.  Negative for fever and chills.   HENT: Negative.    Eyes: Negative.    Respiratory: Negative.  Negative for shortness of breath.    Cardiovascular: Negative.  Negative for chest pain.    Gastrointestinal: Negative.  Negative for nausea, vomiting and diarrhea.   Genitourinary: Negative.  Negative for dysuria, urgency, frequency, hematuria and enuresis.   Musculoskeletal: Positive for arthralgias.   Skin: Negative.    Neurological: Positive for seizures.   All other systems reviewed and are negative.        Filed Vitals:    05/06/09 2003 05/06/09 2010   BP:  110/74   Height: 5\' 4"  (1.626 m)    Weight: 130 lb (58.968 kg)               Physical Exam   Nursing note and vitals reviewed.   GEN: NAD  HENT: moist oral mucosa, no erythema or exudate of pharynx, no erythema or edema of nasal mucosa  Eyes: PERRL, EOMI  NECK: no tenderness, painless ROM, no lymphadenopathy  CARD: RRR, no murmur, rub or gallop  LUNG: clear to ausculation bilaterally without wheezes, rhonchi or crackles  ABD: soft, non-tender, non-distended, no rebound or guarding, bowel sounds WNL  EXT: tenderness to palpation of L ATFL; no lateral/medial malleolus or distal tib-fib tenderness; full and painless ROM, no swelling or deformity noted  SKIN: no rash noted  NEURO: cranial nerves 2-12 grossly intact; normal finger-to-nose; no pronator drift; normal strength to BU/BLE; alert and nonfocal  Written by Warnell Forester, ED Scribe, as dictated by Estrella Deeds, MD.      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 I discussed with her that it is not my practice to give dilaudid for headaches and that an ankle sprain does not warrant dilaudid. I also explained that due to valium used for anxiety and pseudoseizures, that I would not feel comfortable giving her dilaudid at this time.

## 2009-05-06 NOTE — ED Notes (Signed)
Family members at bedside, pt instructed to call if gets aura again

## 2009-05-06 NOTE — ED Notes (Signed)
Called to room for seizure by family.  Pt. noted to have jerking in arms and legs.  No urinary incontinence.  Lasted 30 sec.

## 2009-05-07 LAB — METABOLIC PANEL, BASIC
Anion gap: 7 mmol/L (ref 5–15)
BUN/Creatinine ratio: 16 (ref 12–20)
BUN: 11 MG/DL (ref 6–20)
CO2: 27 MMOL/L (ref 21–32)
Calcium: 8.8 MG/DL (ref 8.5–10.1)
Chloride: 106 MMOL/L (ref 97–108)
Creatinine: 0.7 MG/DL (ref 0.6–1.3)
GFR est AA: >60 mL/min/{1.73_m2} (ref >60)
GFR est non-AA: >60 mL/min/{1.73_m2} (ref >60)
Glucose: 76 MG/DL (ref 65–100)
Potassium: 4 MMOL/L (ref 3.5–5.1)
Sodium: 140 MMOL/L (ref 136–145)

## 2009-05-07 LAB — MAGNESIUM: Magnesium: 1.8 MG/DL (ref 1.6–2.4)

## 2009-05-07 MED ORDER — OXCARBAZEPINE 300 MG/5 ML ORAL SUSP
300 mg/5 mL (60 mg/mL) | Freq: Once | ORAL | Status: DC
Start: 2009-05-07 — End: 2009-05-06

## 2009-05-07 MED ORDER — OXCARBAZEPINE 300 MG TAB
300 mg | Freq: Once | ORAL | Status: AC
Start: 2009-05-07 — End: 2009-05-06
  Administered 2009-05-07: 01:00:00 via ORAL

## 2009-05-07 MED ORDER — DIAZEPAM 5 MG/ML SYRINGE
5 mg/mL | INTRAMUSCULAR | Status: AC
Start: 2009-05-07 — End: 2009-05-06
  Administered 2009-05-07: 01:00:00 via INTRAVENOUS

## 2009-05-07 MED ORDER — SODIUM CHLORIDE 0.9 % IJ SYRG
INTRAMUSCULAR | Status: AC
Start: 2009-05-07 — End: 2009-05-06
  Administered 2009-05-07: 01:00:00

## 2009-05-07 MED FILL — OXCARBAZEPINE 300 MG TAB: 300 mg | ORAL | Qty: 1

## 2009-05-07 MED FILL — OXCARBAZEPINE 300 MG/5 ML ORAL SUSP: 300 mg/5 mL (60 mg/mL) | ORAL | Qty: 5

## 2009-05-07 MED FILL — SALINE FLUSH INJECTION SYRINGE: INTRAMUSCULAR | Qty: 40

## 2009-05-07 MED FILL — DIAZEPAM 5 MG/ML SYRINGE: 5 mg/mL | INTRAMUSCULAR | Qty: 2

## 2009-05-16 NOTE — ED Notes (Signed)
Assumed care of pt. Alert and Oriented x 4. No acute distress. Placed on monitor x3. Placed in gown and position of comfort. Call bell given. Will continue to monitor.

## 2009-05-16 NOTE — ED Provider Notes (Addendum)
HPI Comments: Shannon [name] is a 34 y.o. female with significant hx of seizures who presents via EMS to ED with CC of witnessed seizure PTA that lasted approximately 30 seconds.  Pt reports that she felt a metallic taste in her mouth, then started to shake, and had a seizure.  Pt reports that she recalled the entire event.  Pt reports that her seizures are brought on by stress and that she has been under a lot of stress lately.  Pt reports that she sees Dr. Tiburcio Pea (Neuro) for her seizures, but reports that she has not followed up since the last seizure, but plans to shortly.  Pt reports that she takes 300 mg tablets of Trileptal daily and  200 mg tablets of Topamax BID for seizures.  Pt additionally reports headache s/p seizure.  Pt reports that her LNMP was 2 days ago.    PCP: Paulino Rily, MD  PMHx: seizures, fibromyalgia  ALL:  Pcn (Penicillins), Sulfa (Sulfonamide Antibiotics), Ketorolac, Ciprofloxacin, Benadryl (Diphenhydramine Hcl), Darvocet A500 (Propoxyphene N-acetaminophen), Aspirin      There are no other complaints, changes or physical findings at this time. 11:28 PM  Written by Bryon Lions, ED Scribe as dictated by Pierre Bali, MD       The history is provided by the patient.        Past Medical History   Diagnosis Date   ??? Seizures    ??? Other ill-defined conditions      fibromyalgia   ??? Other ill-defined conditions      chronic ear disease    ??? Other ill-defined conditions      toxic mildew syndrome   ??? Arthritis           Past Surgical History   Procedure Date   ??? Hx orthopaedic      left arm    ??? Hx heent            No family history on file.     History   Social History   ??? Marital Status: Single     Spouse Name: N/A     Number of Children: N/A   ??? Years of Education: N/A   Occupational History   ??? Not on file.   Social History Main Topics   ??? Smoking status: Current Everyday Smoker -- 0.5 packs/day   ??? Smokeless tobacco: Not on file   ??? Alcohol Use: No   ??? Drug Use: No    ??? Sexually Active: Yes -- Female partner(s)   Other Topics Concern   ??? Not on file   Social History Narrative   ??? No narrative on file                    ALLERGIES: Pcn, Sulfa (sulfonamide antibiotics), Ketorolac, Ciprofloxacin, Benadryl, Darvocet a500 and Aspirin      Review of Systems   Constitutional: Negative.    Eyes: Negative.    Respiratory: Negative.    Cardiovascular: Negative.    Gastrointestinal: Negative.    Genitourinary: Negative.    Musculoskeletal: Negative.    Skin: Negative.    Neurological: Positive for seizures and headaches.   All other systems reviewed and are negative.        Filed Vitals:    05/16/09 2304   BP: 98/50   Temp: 97.6 ??F (36.4 ??C)   Height: 5' 4.5" (1.638 m)   Weight: 140 lb (63.504 kg)  Physical Exam   Nursing note and vitals reviewed.  Constitutional: She is oriented to person, place, and time. She appears well-developed and well-nourished. No distress.   HENT:   Head: Normocephalic and atraumatic.   Eyes: Extraocular motions are normal. Pupils are equal, round, and reactive to light.   Neck: Normal range of motion. Neck supple.   Cardiovascular: Normal rate, regular rhythm, normal heart sounds and intact distal pulses.  Exam reveals no friction rub.    No murmur heard.  Pulmonary/Chest: Effort normal and breath sounds normal. No respiratory distress. She has no wheezes. She has no rales.   Abdominal: Soft. Bowel sounds are normal. She exhibits no distension. No tenderness. She has no rebound and no guarding.   Musculoskeletal: Normal range of motion.   Neurological: She is alert and oriented to person, place, and time. No cranial nerve deficit. Coordination normal.   Skin: Skin is warm and dry. She is not diaphoretic. No pallor.   Psychiatric: She has a normal mood and affect. Her behavior is normal.        MDM     Amount and/or Complexity of Data Reviewed:   Clinical lab tests:  Ordered and reviewed   Obtain history from someone other than the patient:  No       Procedures    1:53 AM  Pt violent with staff. Deputy called. Patient does not have a ride home. Pt is attempting to leave emergency department. Pt had one seizure here in ED but was awake for entire event and able to recount it. 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 Pt refusing to wait for ride after ativan and fioricet. Pt signed out AMA

## 2009-05-17 LAB — METABOLIC PANEL, BASIC
Anion gap: 12 mmol/L (ref 5–15)
BUN/Creatinine ratio: 22 — ABNORMAL HIGH (ref 12–20)
BUN: 13 MG/DL (ref 6–20)
CO2: 25 MMOL/L (ref 21–32)
Calcium: 10 MG/DL (ref 8.5–10.1)
Chloride: 100 MMOL/L (ref 97–108)
Creatinine: 0.6 MG/DL (ref 0.6–1.3)
GFR est AA: >60 mL/min/{1.73_m2} (ref >60)
GFR est non-AA: >60 mL/min/{1.73_m2} (ref >60)
Glucose: 105 MG/DL — ABNORMAL HIGH (ref 65–100)
Potassium: 3.8 MMOL/L (ref 3.5–5.1)
Sodium: 137 MMOL/L (ref 136–145)

## 2009-05-17 MED ORDER — LORAZEPAM 2 MG/ML IJ SOLN
2 mg/mL | INTRAMUSCULAR | Status: AC
Start: 2009-05-17 — End: 2009-05-17
  Administered 2009-05-17: 05:00:00 via INTRAVENOUS

## 2009-05-17 MED ORDER — BUTALBITAL-ACETAMINOPHEN-CAFFEINE 50 MG-325 MG-40 MG TAB
50-325-40 mg | ORAL | Status: AC
Start: 2009-05-17 — End: 2009-05-17
  Administered 2009-05-17: 05:00:00 via ORAL

## 2009-05-17 MED ORDER — SODIUM CHLORIDE 0.9% BOLUS IV
0.9 % | Freq: Once | INTRAVENOUS | Status: AC
Start: 2009-05-17 — End: 2009-05-17
  Administered 2009-05-17: 05:00:00 via INTRAVENOUS

## 2009-05-17 MED FILL — SODIUM CHLORIDE 0.9 % IV: INTRAVENOUS | Qty: 500

## 2009-05-17 MED FILL — BUTALBITAL-ACETAMINOPHEN-CAFFEINE 50 MG-325 MG-40 MG TAB: 50-325-40 mg | ORAL | Qty: 1

## 2009-05-17 MED FILL — LORAZEPAM 2 MG/ML IJ SOLN: 2 mg/mL | INTRAMUSCULAR | Qty: 1

## 2009-05-17 NOTE — ED Notes (Signed)
Patient pulled herself off the monitor and pulled her IV out with catheter intact. Patient beligerent and rude stating "I am leaving." [employer] at bedside. Patient informed by the deputy that she may not leave without a ride until 5:15 due being medicated with Ativan and Fiorocet. Patient repeatedly stating "I always take Valium and Fiorocet and drive." Patient reports that she does not have anyone to come get here. Patient informed that we would arrange for a cab to come get her at 5:15 if she is not able to find a ride. Patient ran out of room to lobby, screaming "I'm calling a cab." Leisure centre manager with patient. Nursing supervisor contacted. Patient brought back in to the ED and provided with a number for yellow cab. Patient requests a cab voucher. Patient informed that she could not have a cab voucher, due to her leaving after being medicated with narcotics. Patient ambulatory to waiting room with a steady gait.

## 2009-05-17 NOTE — ED Notes (Signed)
Pt on the phone trying to get a ride home.

## 2009-05-17 NOTE — ED Notes (Signed)
Pt resting in no acute distress. Seizure free. Pain medication given for headache. Will re-evaluate in 30 minutes.

## 2009-05-18 LAB — TOPIRAMATE: TOPIRAMATE: <1.5 ug/mL (ref 5.0–20.0)

## 2009-05-18 LAB — OXCARBAZEPINE(TRILEPTAL): 10-Hydroxycarbazine: <1 ug/mL — ABNORMAL LOW (ref 15–35)

## 2009-11-24 MED ORDER — SODIUM CHLORIDE 0.9 % IV
Freq: Once | INTRAVENOUS | Status: AC
Start: 2009-11-24 — End: 2009-11-24
  Administered 2009-11-24: 14:00:00 via INTRAVENOUS

## 2009-11-24 MED ORDER — SODIUM CHLORIDE 0.9 % IJ SYRG
Freq: Once | INTRAMUSCULAR | Status: AC
Start: 2009-11-24 — End: 2009-11-24
  Administered 2009-11-24: 14:00:00 via INTRAVENOUS

## 2009-11-24 MED ORDER — IOVERSOL 320 MG/ML IV SOLN
320 mg iodine/mL | Freq: Once | INTRAVENOUS | Status: AC
Start: 2009-11-24 — End: 2009-11-24
  Administered 2009-11-24: 14:00:00 via INTRAVENOUS

## 2010-02-07 NOTE — Telephone Encounter (Signed)
Called pt regarding missed appointment for eval of gallbladder. When called pt she actually was on the way home from [facility] she had surgery 1/17. She went back to the er over the weekend & surgery done on er basis. She was thankful for the call. See notes scanned from [facility] testing if need.

## 2010-08-09 NOTE — Progress Notes (Signed)
I have reviewed discharge instructions with the patient.  The patient verbalized understanding.
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
## 2010-08-09 NOTE — ED Provider Notes (Signed)
I was personally available for consultation in the emergency department.  I have reviewed the chart and agree with the documentation recorded by the MLP, including the assessment, treatment plan, and disposition.  Ernesto Lashway P Versie Soave, MD

## 2010-08-09 NOTE — ED Notes (Signed)
Patient given ice pack and transferred to x-ray.
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
## 2010-08-09 NOTE — ED Notes (Signed)
Pt. Medicated per MD order.

## 2010-08-09 NOTE — ED Notes (Signed)
Ace wrap to right knee and patient able to demonstrate ambulating with crutches without incident or difficulty. Discharged with assist to car via wheelchair and 2 prescriptions upon discharge

## 2010-08-10 MED ORDER — PROMETHAZINE 25 MG TAB
25 mg | ORAL | Status: AC
Start: 2010-08-10 — End: 2010-08-09
  Administered 2010-08-10: 01:00:00 via ORAL

## 2010-08-10 MED ORDER — OXYCODONE-ACETAMINOPHEN 5 MG-325 MG TAB
5-325 mg | ORAL | Status: AC
Start: 2010-08-10 — End: 2010-08-09
  Administered 2010-08-10: 01:00:00 via ORAL

## 2010-08-10 MED ORDER — OXYCODONE-ACETAMINOPHEN 5 MG-325 MG TAB
5-325 mg | ORAL_TABLET | ORAL | Status: AC | PRN
Start: 2010-08-10 — End: 2010-08-16

## 2010-08-10 MED ORDER — PROMETHAZINE 25 MG TAB
25 mg | ORAL_TABLET | Freq: Four times a day (QID) | ORAL | Status: DC | PRN
Start: 2010-08-10 — End: 2010-09-07

## 2010-09-07 MED ORDER — HYDROMORPHONE (PF) 2 MG/ML IJ SOLN
2 mg/mL | INTRAMUSCULAR | Status: AC
Start: 2010-09-07 — End: 2010-09-07
  Administered 2010-09-07: 21:00:00 via INTRAMUSCULAR

## 2010-09-07 NOTE — ED Notes (Signed)
Pt c/o "pain all over" and states she can't have a prescription for pain because she in "on a contract".

## 2010-09-07 NOTE — ED Notes (Signed)
Discharge papers given and pt discharged by dr. phillips.

## 2010-09-07 NOTE — ED Notes (Signed)
Assumed care of pt. Pt positioned for comfort, call bell given.  Plan of care discussed with pt.

## 2010-09-07 NOTE — ED Provider Notes (Signed)
HPI Comments: Shannon [name] is a 35 y.o. WF with PMHx significant for fibromyalgia and arthritis who presents ambulatory to the ED with C/O "all over" body pain with nausea x today. Pt associates sx's with flare up of fibromyalgia, lyme disease, herniated disc and other chronic pain. Pt reports being on a pain management contract. Pt states she took a 3mg  morphine this morning w/n/r. Pt denies any injury. Pt further denies F/C, V/D.    PMHx significant for: seizures    There are no other complaints, changes or physical findings at this time.   Written by Houston Siren, ED Scribe as dictated by Inge Rise,*.     The history is provided by the patient.        Past Medical History   Diagnosis Date   ??? Seizures    ??? Other ill-defined conditions      fibromyalgia   ??? Other ill-defined conditions      chronic ear disease    ??? Other ill-defined conditions      toxic mildew syndrome   ??? Arthritis         Past Surgical History   Procedure Date   ??? Hx orthopaedic      left arm    ??? Hx heent        History     Social History   ??? Marital Status: Married     Spouse Name: N/A     Number of Children: N/A   ??? Years of Education: N/A     Occupational History   ??? Not on file.     Social History Main Topics   ??? Smoking status: Current Everyday Smoker -- 0.5 packs/day   ??? Smokeless tobacco: Not on file   ??? Alcohol Use: No   ??? Drug Use: No   ??? Sexually Active: Yes -- Female partner(s)     Other Topics Concern   ??? Not on file     Social History Narrative   ??? No narrative on file          ALLERGIES: Aspirin; Benadryl; Ciprofloxacin; Darvocet a500; Ketorolac; Pcn; and Sulfa(sulfonamide antibiotics)      Review of Systems   Constitutional: Negative.  Negative for fever and chills.   HENT: Negative.  Negative for ear pain, congestion and sore throat.    Eyes: Negative.    Respiratory: Negative.  Negative for cough and shortness of breath.    Cardiovascular: Negative.     Gastrointestinal: Positive for nausea. Negative for vomiting, abdominal pain and diarrhea.   Genitourinary: Negative.  Negative for dysuria and frequency.   Musculoskeletal: Positive for myalgias ("all over" body pain).   Skin: Negative.    Neurological: Negative.  Negative for headaches.   All other systems reviewed and are negative.        Filed Vitals:    09/07/10 1638   BP: 121/83   Pulse: 100   Temp: 98.2 ??F (36.8 ??C)   Resp: 16   Height: 5\' 4"  (1.626 m)   Weight: 79.8 kg (175 lb 14.8 oz)   SpO2: 98%            Physical Exam   Nursing note and vitals reviewed.  Constitutional: She is oriented to person, place, and time. She appears well-developed and well-nourished. She appears distressed.        Crying    HENT:   Head: Normocephalic and atraumatic.   Nose: Nose normal.   Mouth/Throat: Oropharynx is clear  and moist.   Eyes: Conjunctivae and EOM are normal. Pupils are equal, round, and reactive to light. Right eye exhibits no discharge. Left eye exhibits no discharge.   Neck: Normal range of motion. Neck supple. No tracheal deviation present.   Cardiovascular: Normal rate, regular rhythm and normal heart sounds.    No murmur heard.  Pulmonary/Chest: Effort normal and breath sounds normal. No respiratory distress. She has no wheezes. She has no rales.   Abdominal: Soft. Bowel sounds are normal. She exhibits no distension. There is no tenderness. There is no rebound and no guarding.   Musculoskeletal: Normal range of motion. She exhibits no edema and no tenderness.        Diffuse tenderness   Lymphadenopathy:     She has no cervical adenopathy.   Neurological: She is alert and oriented to person, place, and time. No cranial nerve deficit. Coordination normal.   Skin: Skin is warm and dry. No rash noted.   Psychiatric: She has a normal mood and affect. Her behavior is normal. Judgment and thought content normal.        MDM     Amount and/or Complexity of Data Reviewed:     Independant visualization of image, tracing, or specimen:  Yes  Progress:   Patient progress:  Improved and stable      Procedures    4:53 PM  Tacia Hindley Halvorsen's  results have been reviewed with her.  She has been counseled regarding her diagnosis.  She verbally conveys understanding and agreement of the signs, symptoms, diagnosis, treatment and prognosis and additionally agrees to follow up as recommended with PCP in 24 - 48 hours.  She also agrees with the care-plan and conveys that all of her questions have been answered.  I have also put together some discharge instructions for her that include: 1) educational information regarding their diagnosis, 2) how to care for their diagnosis at home, as well a 3) list of reasons why they would want to return to the ED prior to their follow-up appointment, should their condition change.          Written by Zachary George. Sofrata, ED Scribe, as dictated by Inge Rise,*.     1. Chronic pain      Pt's d/c papers reviewed and verified with patient by me. Inge Rise, MD

## 2010-09-21 NOTE — ED Notes (Signed)
Pt. Presents stating she's here for an IM injection of pain medicine because she has a pain contract and the Morphine 30 mg BID doesn't work when she has a "lyme's disease flare-up."  Pt. Reports pain down entire back & pain all over. PA has assessed pt.

## 2010-09-21 NOTE — ED Provider Notes (Signed)
HPI Comments: Shannon [name], a 35 y.o. Female with hx of DDD, presents ambulatory to the ED c/o 10/10 back pain.  Pt was in the ED earlier today with another pt, who was c/o pain from ulnar fracture pain that was not being managed by rx'd percocet.  That pt was persistent regarding the severity of his pain, and was given a shot of dilaudid, as well as a PO rx.  Pt has returned to the ED not as the pt herself, with slurred speech that was not present earlier, claiming a speech impediment.  Pt is asking for narcotic pain medication.  Pt denies any N/V/D, F/C, ABD pain, or HA.    Pt reports that she took 30 mg rx for morphine, and valium today.    PCP: Phys Other    PMHx significant for: seizures, fibromyalgia, toxic mildew syndrome, DDD  PSHx significant for: cholecystectomy  Social Hx: +tobacco    There are no other complaints, changes or physical findings at this time. 3:09 PM   Written by Diamond Nickel. Deal, ED Scribe, as dictated by Gillermina Phy  .     The history is provided by the patient.        Past Medical History   Diagnosis Date   ??? Seizures    ??? Arthritis    ??? Other ill-defined conditions      fibromyalgia   ??? Other ill-defined conditions      chronic ear disease    ??? Other ill-defined conditions      toxic mildew syndrome   ??? Other ill-defined conditions      lyme disease   ??? Other ill-defined conditions      degenerative disc        Past Surgical History   Procedure Date   ??? Hx orthopaedic      left arm    ??? Hx cholecystectomy    ??? Hx heent          No family history on file.     History     Social History   ??? Marital Status: Legally Separated     Spouse Name: N/A     Number of Children: N/A   ??? Years of Education: N/A     Occupational History   ??? Not on file.     Social History Main Topics   ??? Smoking status: Current Everyday Smoker -- 0.5 packs/day   ??? Smokeless tobacco: Not on file   ??? Alcohol Use: No   ??? Drug Use: Yes     Special: Prescription   ??? Sexually Active: Yes -- Female partner(s)      Other Topics Concern   ??? Not on file     Social History Narrative   ??? No narrative on file                  ALLERGIES: Aspirin; Benadryl; Ciprofloxacin; Darvocet a500; Ketorolac; Pcn; and Sulfa(sulfonamide antibiotics)      Review of Systems   Constitutional: Negative.  Negative for fever and chills.   HENT: Negative.    Eyes: Negative.    Respiratory: Negative.    Cardiovascular: Negative.    Gastrointestinal: Negative.  Negative for nausea, vomiting, abdominal pain and diarrhea.   Genitourinary: Negative.    Musculoskeletal: Positive for back pain.   Skin: Negative.    Neurological: Negative.  Negative for headaches.   All other systems reviewed and are negative.  Filed Vitals:    09/21/10 1450   BP: 126/86   Pulse: 84   Temp: 97.6 ??F (36.4 ??C)   Resp: 16   Height: 5\' 4"  (1.626 m)   Weight: 79.6 kg (175 lb 7.8 oz)   SpO2: 99%            Physical Exam   Nursing note and vitals reviewed.  Constitutional: She is oriented to person, place, and time. She appears well-developed and well-nourished. No distress.        Slurred speech, appears obviously intoxicated   HENT:   Head: Normocephalic and atraumatic.   Right Ear: External ear normal.   Left Ear: External ear normal.   Nose: Nose normal.   Mouth/Throat: Oropharynx is clear and moist. No oropharyngeal exudate.   Eyes: Conjunctivae and EOM are normal. Right eye exhibits no discharge. Left eye exhibits no discharge. No scleral icterus.        Pupils grossly dilated and slightly reactive to light   Neck: Normal range of motion. Neck supple. No JVD present. No tracheal deviation present.   Cardiovascular: Normal rate, regular rhythm, normal heart sounds and intact distal pulses.  Exam reveals no gallop and no friction rub.    No murmur heard.  Pulmonary/Chest: Effort normal and breath sounds normal. No respiratory distress. She has no wheezes. She has no rales. She exhibits no tenderness.    Abdominal: Soft. Bowel sounds are normal. She exhibits no distension and no mass. There is no tenderness. There is no rebound and no guarding.   Musculoskeletal: Normal range of motion. She exhibits no edema and no tenderness.   Lymphadenopathy:     She has no cervical adenopathy.   Neurological: She is alert and oriented to person, place, and time. She has normal reflexes. No cranial nerve deficit. She exhibits normal muscle tone. Coordination normal.   Skin: Skin is warm and dry. She is not diaphoretic.   Psychiatric: She has a normal mood and affect. Her behavior is normal. Judgment and thought content normal.    Written by Diamond Nickel. Deal, ED Scribe, as dictated by Gillermina Phy .     MDM    Procedures    3:16 PM  Discussed pt with Nicki Reaper, * who agrees with care plan.  Written by Diamond Nickel. Deal, ED Scribe, as dictated by Gillermina Phy .     Discharge Note  3:23 PM  Reviewed available results with pt.  Counseled pt on Dx and care plan.  Pt understands Dx and care plan and all questions have been answered.  Pt agrees with care plan, and agrees to f/u with her pain management doctor as recommended.  Discharge instructions have been provided to the pt, along with reasons to return to the ED if sxs worsen. Pt was not given any rxs.  Written by Diamond Nickel. Deal, ED scribe, as dictated by PA-C Royden Purl .

## 2010-09-21 NOTE — ED Notes (Signed)
Pt. Was discharged by PA.  Pt. Did not receive pain medicine as she was demanding, so patient started yelling "I'm going to have this place shut down, my pain management doctor is going to get you" (speaking to [employer], PA-C) and stating "my doctor sent me here, this is ridiculous."  Pt. Had to be escorted out of ED by 2 security officers.

## 2010-09-21 NOTE — ED Provider Notes (Signed)
I was personally available for consultation in the emergency department.  I have reviewed the chart and agree with the documentation recorded by the MLP, including the assessment, treatment plan, and disposition.  Lanorris Kalisz J Orlen Leedy, MD

## 2010-09-22 LAB — CBC WITH AUTOMATED DIFF
ABS. BASOPHILS: 0.1 10*3/uL (ref 0.0–0.1)
ABS. EOSINOPHILS: 0.3 10*3/uL (ref 0.0–0.4)
ABS. LYMPHOCYTES: 4.5 10*3/uL — ABNORMAL HIGH (ref 0.8–3.5)
ABS. MONOCYTES: 0.7 10*3/uL (ref 0.0–1.0)
ABS. NEUTROPHILS: 4.4 10*3/uL (ref 1.8–8.0)
BASOPHILS: 1 % (ref 0–1)
EOSINOPHILS: 3 % (ref 0–7)
HCT: 46.3 % (ref 35.0–47.0)
HGB: 16.4 g/dL — ABNORMAL HIGH (ref 11.5–16.0)
LYMPHOCYTES: 45 % (ref 12–49)
MCH: 33 PG (ref 26.0–34.0)
MCHC: 35.4 g/dL (ref 30.0–36.5)
MCV: 93.2 FL (ref 80.0–99.0)
MONOCYTES: 7 % (ref 5–13)
NEUTROPHILS: 44 % (ref 32–75)
PLATELET: 307 10*3/uL (ref 150–400)
RBC: 4.97 M/uL (ref 3.80–5.20)
RDW: 13.5 % (ref 11.5–14.5)
WBC: 10 10*3/uL (ref 3.6–11.0)

## 2010-09-22 LAB — URINALYSIS W/ REFLEX CULTURE
Bacteria: NEGATIVE /HPF
Bilirubin: NEGATIVE
Blood: NEGATIVE
Glucose: NEGATIVE MG/DL
Ketone: NEGATIVE MG/DL
Leukocyte Esterase: NEGATIVE
Nitrites: NEGATIVE
Protein: NEGATIVE MG/DL
Specific gravity: 1.007 (ref 1.003–1.030)
Urobilinogen: 0.2 EU/DL (ref 0.2–1.0)
pH (UA): 6.5 (ref 5.0–8.0)

## 2010-09-22 LAB — METABOLIC PANEL, COMPREHENSIVE
A-G Ratio: 0.9 — ABNORMAL LOW (ref 1.1–2.2)
ALT (SGPT): 52 U/L (ref 12–78)
AST (SGOT): 28 U/L (ref 15–37)
Albumin: 4.1 g/dL (ref 3.5–5.0)
Alk. phosphatase: 149 U/L — ABNORMAL HIGH (ref 50–136)
Anion gap: 6 mmol/L (ref 5–15)
BUN/Creatinine ratio: 21 — ABNORMAL HIGH (ref 12–20)
BUN: 19 MG/DL (ref 6–20)
Bilirubin, total: 0.2 MG/DL (ref 0.2–1.0)
CO2: 28 MMOL/L (ref 21–32)
Calcium: 9.7 MG/DL (ref 8.5–10.1)
Chloride: 100 MMOL/L (ref 97–108)
Creatinine: 0.9 MG/DL (ref 0.6–1.3)
GFR est AA: >60 mL/min/{1.73_m2} (ref >60)
GFR est non-AA: >60 mL/min/{1.73_m2} (ref >60)
Globulin: 4.6 g/dL — ABNORMAL HIGH (ref 2.0–4.0)
Glucose: 103 MG/DL — ABNORMAL HIGH (ref 65–100)
Potassium: 4.3 MMOL/L (ref 3.5–5.1)
Protein, total: 8.7 g/dL — ABNORMAL HIGH (ref 6.4–8.2)
Sodium: 134 MMOL/L — ABNORMAL LOW (ref 136–145)

## 2010-09-22 LAB — MAGNESIUM: Magnesium: 2.2 MG/DL (ref 1.6–2.4)

## 2010-09-22 LAB — DRUG SCREEN, URINE
AMPHETAMINES: NEGATIVE
BARBITURATES: NEGATIVE
BENZODIAZEPINES: POSITIVE — AB
COCAINE: NEGATIVE
METHADONE: NEGATIVE
OPIATES: POSITIVE — AB
PCP(PHENCYCLIDINE): NEGATIVE
THC (TH-CANNABINOL): NEGATIVE

## 2010-09-22 LAB — CK W/ CKMB & INDEX
CK - MB: <0.5 NG/ML — ABNORMAL LOW (ref 0.5–3.6)
CK: 39 U/L (ref 26–192)

## 2010-09-22 LAB — ETHYL ALCOHOL: ALCOHOL(ETHYL),SERUM: <10 MG/DL (ref <10)

## 2010-09-22 LAB — TROPONIN I: Troponin-I, Qt.: <0.04 ng/mL (ref <0.05)

## 2010-09-22 LAB — PROTHROMBIN TIME + INR
INR: 1 (ref 0.9–1.1)
Prothrombin time: 10.7 s (ref 9.4–11.7)

## 2010-09-22 LAB — PTT: aPTT: 28.7 s (ref 24.0–31.5)

## 2010-09-22 MED ORDER — SODIUM CHLORIDE 0.9 % IJ SYRG
INTRAMUSCULAR | Status: AC
Start: 2010-09-22 — End: 2010-09-22
  Administered 2010-09-22: 19:00:00

## 2010-09-22 MED ORDER — MORPHINE 4 MG/ML SYRINGE
4 mg/mL | INTRAMUSCULAR | Status: AC
Start: 2010-09-22 — End: 2010-09-22

## 2010-09-22 MED ORDER — ONDANSETRON (PF) 4 MG/2 ML INJECTION
4 mg/2 mL | INTRAMUSCULAR | Status: AC
Start: 2010-09-22 — End: 2010-09-22
  Administered 2010-09-22: 20:00:00 via INTRAVENOUS

## 2010-09-22 MED ORDER — MORPHINE 4 MG/ML SYRINGE
4 mg/mL | INTRAMUSCULAR | Status: AC
Start: 2010-09-22 — End: 2010-09-22
  Administered 2010-09-22: 20:00:00 via INTRAVENOUS

## 2010-09-22 MED ORDER — SODIUM CHLORIDE 0.9% BOLUS IV
0.9 % | INTRAVENOUS | Status: AC
Start: 2010-09-22 — End: 2010-09-22
  Administered 2010-09-22: 19:00:00 via INTRAVENOUS

## 2010-09-22 MED ORDER — OXYCODONE-ACETAMINOPHEN 5 MG-325 MG TAB
5-325 mg | ORAL | Status: AC
Start: 2010-09-22 — End: 2010-09-22
  Administered 2010-09-22: 19:00:00 via ORAL

## 2010-09-22 NOTE — ED Notes (Signed)
CT completed, pt medicated for pain and nausea

## 2010-09-22 NOTE — ED Notes (Signed)
Assumed care of patient. Patient resting in bed. Patient alert and cooperative. Skin warm and dry. Respirations even and unlabored. Patient reports 2 seizures PTA. Patient seen here yesterday for back pain. Patient reports neck pain currently. Denies loss of bowel/bladder or biting tongue during seizure. Patient reports "I was seizing because of my pain."

## 2010-09-22 NOTE — ED Notes (Signed)
Bedside shift change report given to Donna Rice, RN

## 2010-09-22 NOTE — ED Notes (Signed)
Dr Smith in to see

## 2010-09-22 NOTE — ED Notes (Signed)
Assumed care of patient. Patient placed in position of comfort. Call bell in reach.

## 2010-09-22 NOTE — ED Notes (Signed)
Patient ambulatory to bathroom with steady gait. Family at bedside.

## 2010-09-22 NOTE — ED Notes (Signed)
Patient requesting pain medication. Dr. Katrinka Blazing made aware.

## 2010-09-22 NOTE — ED Provider Notes (Signed)
HPI Comments: 35 y.o. female with pmhx significant for epilepsy presents via EMS to ED for evaluation of a seizure lasing 10 minutes x just PTA. Witness state that the Pt was confused and non-verbal for 5 minutes after the episode, and the Pt denies any recent changes in her seizure medications. Pt reports an associated headache radiating to neck. Pt states she was seen here at [facility] ED yesterday for low back pain, and states "the pain was so bad I had a seizure." Pt states she takes 30 mg morphine, and valium for chronic pain, DDD, and fibromyalgia, and notes she is on a pain management contract. Pt also reports an increase in life stresses. Pt denies any CP, SOB, ABD pain, N/V/D, F/C, cough or rash.    Neurology: Dr. Jerrilyn Cairo  Social Hx: +tobacco (2 cigarettes per day)  PMHx significant for: epilepsy, fibromyalgia, DDD  PSHx significant for: cholecystectomy     There are no other complaints, changes or physical findings at this time. 2:17 PM  Written by Ena Dawley, ED Scribe; Dictated by Neldon Newport, MD.    The history is provided by the patient.        Past Medical History   Diagnosis Date   ??? Seizures    ??? Arthritis    ??? Other ill-defined conditions      fibromyalgia   ??? Other ill-defined conditions      chronic ear disease    ??? Other ill-defined conditions      toxic mildew syndrome   ??? Other ill-defined conditions      lyme disease   ??? Other ill-defined conditions      degenerative disc        Past Surgical History   Procedure Date   ??? Hx orthopaedic      left arm    ??? Hx cholecystectomy    ??? Hx heent    ??? Hx gyn      BTL         No family history on file.     History     Social History   ??? Marital Status: Legally Separated     Spouse Name: N/A     Number of Children: N/A   ??? Years of Education: N/A     Occupational History   ??? Not on file.     Social History Main Topics   ??? Smoking status: Current Everyday Smoker -- 0.5 packs/day   ??? Smokeless tobacco: Not on file   ??? Alcohol Use: No    ??? Drug Use: Yes     Special: Prescription   ??? Sexually Active: Yes -- Female partner(s)     Other Topics Concern   ??? Not on file     Social History Narrative   ??? No narrative on file                  ALLERGIES: Aspirin; Benadryl; Ciprofloxacin; Darvocet a500; Ketorolac; Pcn; and Sulfa(sulfonamide antibiotics)      Review of Systems   Constitutional: Negative.  Negative for fever and chills.   HENT: Positive for neck pain.    Eyes: Negative.    Respiratory: Negative.  Negative for cough and shortness of breath.    Cardiovascular: Negative.  Negative for chest pain.   Gastrointestinal: Negative.  Negative for nausea, vomiting, abdominal pain and diarrhea.   Genitourinary: Negative.    Skin: Negative.  Negative for rash.   Neurological: Positive for seizures and headaches.  All other systems reviewed and are negative.        Filed Vitals:    09/22/10 1409 09/22/10 1503   BP: 110/65 102/64   Pulse: 78 81   Temp: 98.4 ??F (36.9 ??C)    Resp: 18 18   SpO2: 99% 98%            Physical Exam   Nursing note and vitals reviewed.  Constitutional: She is oriented to person, place, and time. She appears well-developed and well-nourished. She appears distressed.   HENT:   Head: Normocephalic and atraumatic.   Eyes: EOM are normal. Pupils are equal, round, and reactive to light.   Neck: Normal range of motion. Neck supple.   Cardiovascular: Normal rate, regular rhythm, normal heart sounds and intact distal pulses.  Exam reveals no friction rub.    No murmur heard.  Pulmonary/Chest: Effort normal and breath sounds normal. No respiratory distress. She has no wheezes. She has no rales. She exhibits no tenderness.   Abdominal: Soft. Bowel sounds are normal. She exhibits no distension. There is no tenderness. There is no rebound and no guarding.   Musculoskeletal: Normal range of motion. She exhibits no edema and no tenderness.    Neurological: She is alert and oriented to person, place, and time. She exhibits normal muscle tone. Coordination normal.   Skin: Skin is warm and dry. She is not diaphoretic. No pallor.   Psychiatric: She has a normal mood and affect. Her behavior is normal.        MDM     Differential Diagnosis; Clinical Impression; Plan:     Pseudoseizure, metabolic, dehydration, electrolyte imbalance, closed head injury  Amount and/or Complexity of Data Reviewed:   Clinical lab tests:  Ordered and reviewed  Tests in the radiology section of CPT??:  Ordered and reviewed   Decide to obtain previous medical records or to obtain history from someone other than the patient:  Yes (EMS upon arrival)   Obtain history from someone other than the patient:  Yes   Review and summarize past medical records:  Yes  Progress:   Patient progress:  Stable and improved      Procedures    3:41 PM  Pt's husband is at the bedside and states that the Pt's seizures were 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   Written by Ena Dawley, ED Scribe; Dictated by Neldon Newport, MD.    3:58 PM  Pt re-evaluated and is feeling much better. Pt is ready to be discharged. Pt states she has an appointment with Dr. Tiburcio Pea already scheduled, and was advised to keep her appointment.   Written by Ena Dawley, ED Scribe; Dictated by Neldon Newport, MD.    3:59 PM   PROGRESS NOTE:   Pt has been re-examined and is feeling much better.  All diagnostic results have been reviewed and discussed with pt.  Care plan has been outlined and pt understands all current sx, dx, tx, and rx.  There are no new complaints, changes, or physical findings at this time.  All questions have been addressed.  Pt was instructed to and agrees to follow up with Dr. Tiburcio Pea as scheduled , as well as return to ED upon further deterioration.  Shannon [name] is ready for discharge.   Written by Ena Dawley, ED Scribe, as dictated by Neldon Newport, MD.

## 2010-09-25 LAB — TOPIRAMATE: Topiramate: NOT DETECTED ug/mL (ref 2.0–25.0)

## 2010-09-25 LAB — OXCARBAZEPINE(TRILEPTAL): Oxcarbazepine: NOT DETECTED ug/mL (ref 10–35)

## 2010-11-10 MED ORDER — FLUORESCEIN 1 MG EYE STRIPS
1 mg | OPHTHALMIC | Status: AC
Start: 2010-11-10 — End: 2010-11-10
  Administered 2010-11-10: via OPHTHALMIC

## 2010-11-10 MED ORDER — TETRACAINE 0.5 % EYE DROPS
0.5 % | OPHTHALMIC | Status: AC
Start: 2010-11-10 — End: 2010-11-10
  Administered 2010-11-10: via OPHTHALMIC

## 2010-11-10 MED ORDER — ERYTHROMYCIN 5 MG/G EYE OINTMENT
5 mg/gram (0. %) | OPHTHALMIC | Status: AC
Start: 2010-11-10 — End: 2010-11-10
  Administered 2010-11-11: via OPHTHALMIC

## 2010-11-10 NOTE — ED Provider Notes (Signed)
HPI Comments: 35 y.o female presents ambulatory to [facility] ED with cc of L eye pain (7/10) x 1 day. Pt reports she put contacts in for the first time yesterday and approximately 1 hour after putting them in she experienced "scratching and burning sensation" to L eye. Pt reports gradually worsening pain since onset this morning. Pt denies any discharge from eye but reports she has been "tearing up" all day. Pt denies any F/C, cough, sore throat, HA, diaphoresis, or rash.     Social Hx: +tobacco, -EtOH    There are no new findings, complaints, or complications at this time.  Written by Jackelyn Hoehn, ED Scribe, as dictated by Theone Murdoch.      The history is provided by the patient.        Past Medical History   Diagnosis Date   ??? Seizures    ??? Arthritis    ??? Other ill-defined conditions      fibromyalgia   ??? Other ill-defined conditions      chronic ear disease    ??? Other ill-defined conditions      toxic mildew syndrome   ??? Other ill-defined conditions      lyme disease   ??? Other ill-defined conditions      degenerative disc        Past Surgical History   Procedure Date   ??? Hx orthopaedic      left arm    ??? Hx cholecystectomy    ??? Hx heent    ??? Hx gyn      BTL         No family history on file.     History     Social History   ??? Marital Status: Legally Separated     Spouse Name: N/A     Number of Children: N/A   ??? Years of Education: N/A     Occupational History   ??? Not on file.     Social History Main Topics   ??? Smoking status: Current Everyday Smoker -- 0.5 packs/day   ??? Smokeless tobacco: Not on file   ??? Alcohol Use: No   ??? Drug Use: Yes     Special: Prescription   ??? Sexually Active: Yes -- Female partner(s)     Other Topics Concern   ??? Not on file     Social History Narrative   ??? No narrative on file                  ALLERGIES: Aspirin; Benadryl; Ciprofloxacin; Darvocet a500; Ketorolac; Pcn; and Sulfa(sulfonamide antibiotics)      Review of Systems    Constitutional: Negative.  Negative for fever, chills and diaphoresis.   HENT: Negative.  Negative for sore throat.    Eyes: Positive for pain and redness.   Respiratory: Negative.  Negative for cough.    Cardiovascular: Negative.    Gastrointestinal: Negative.    Genitourinary: Negative.    Musculoskeletal: Negative.    Skin: Negative.  Negative for rash.   Neurological: Negative.  Negative for headaches.   All other systems reviewed and are negative.        Filed Vitals:    11/10/10 1843   BP: 120/79   Pulse: 63   Temp: 98 ??F (36.7 ??C)   Resp: 18   Height: 5' 4.5" (1.638 m)   Weight: 79.9 kg (176 lb 2.4 oz)   SpO2: 97%  Physical Exam   Constitutional: She is oriented to person, place, and time. She appears well-developed and well-nourished. No distress.   HENT:   Head: Normocephalic and atraumatic.   Right Ear: External ear normal.   Left Ear: External ear normal.   Nose: Nose normal.   Mouth/Throat: Oropharynx is clear and moist.   Eyes: Conjunctivae and EOM are normal. Pupils are equal, round, and reactive to light.            Corneas clear.    Neck: Normal range of motion. Neck supple.   Cardiovascular: Normal rate, regular rhythm and normal heart sounds.    Pulmonary/Chest: Effort normal and breath sounds normal. No respiratory distress. She has no wheezes. She has no rales. She exhibits no tenderness.   Abdominal: Soft. Bowel sounds are normal. She exhibits no distension and no mass. There is no tenderness. There is no rebound and no guarding.   Musculoskeletal: Normal range of motion.   Neurological: She is alert and oriented to person, place, and time. She has normal reflexes.   Skin: Skin is warm and dry. No rash noted. She is not diaphoretic.   Psychiatric: She has a normal mood and affect. Her behavior is normal.   Written by Jackelyn Hoehn, ED Scribe, as dictated by PA-C Drinda Butts.      MDM    Procedures    Procedure Note - Wood's lamp exam:  7:57 PM   Performed by: Theone Murdoch  Pt???s left eye was anesthetized with tetracaine, stained with fluorescein, and examined with a Wood's lamp, using lid eversion.    Foreign body: no  Fluorescein uptake: yes, showing corneal abrasion covering majority of pupil  The procedure took 1-15 minutes, and pt tolerated well.  Written by Jackelyn Hoehn, ED Scribe, as dictated by Theone Murdoch.          MEDICATIONS GIVEN:    Medications   gentamicin (GARAMYCIN) 0.3 % ophthalmic solution (not administered)   HYDROcodone-acetaminophen (NORCO) 5-325 mg per tablet (not administered)   tetracaine (PONTOCAINE) 0.5 % ophthalmic solution 1 Drop (1 Drop Both Eyes Given by Provider 11/10/10 1940)   fluorescein (FUL-GLO) 1 mg ophthalmic strip 1 Strip (1 Strip Both Eyes Given by Provider 11/10/10 1940)   erythromycin (ILOTYCIN) 5 mg/gram (0.5 %) ophthalmic ointment (  Ophthalmic Given 11/10/10 2023)       IMPRESSION:  1. Corneal abrasion      Given her significant allergy to Cipro, the very brief (less than one hour) exposure to her first pair of brand new contacts, doubt contacts-related infectious process.  Likely corneal abrasion secondary to traumatic insertion of contact. She agrees to follow up with her eye doctor with whom she has an established relationship tomorrow for continued care. She declined prescription for pain medication. Shanon Payor, PA      PLAN:  1. Gentamycin eye drops - every 2 hours until seen by opthalmology tomorrow  2. Return if worse.   Return to ED if worse

## 2010-11-10 NOTE — ED Notes (Signed)
Assumed care of pt. Pt in position of comfort, call bell within reach. Pt reports putting in new contacts last night and her left eye became irritated. She removed them and now she feels like something is in her eye and it is red. She notes flushing several times with no relief.

## 2010-11-10 NOTE — ED Notes (Signed)
Medication given to Shannon [name], [facility] for eye exam

## 2010-11-10 NOTE — ED Notes (Signed)
Donnella Bi, [facility] reviewed discharge instructions with the patient.  The patient verbalized understanding.  Pt has a ride for discharge, pt ambulatory at this time.  No further questions

## 2010-11-11 MED ORDER — HYDROCODONE-ACETAMINOPHEN 5 MG-325 MG TAB
5-325 mg | ORAL_TABLET | ORAL | Status: DC | PRN
Start: 2010-11-11 — End: 2011-02-02

## 2010-11-11 MED ORDER — GENTAMICIN 0.3 % EYE DROPS
0.3 % | OPHTHALMIC | Status: DC
Start: 2010-11-11 — End: 2011-02-02

## 2010-11-12 NOTE — ED Provider Notes (Signed)
I was personally available for consultation in the emergency department.  I have reviewed the chart and agree with the documentation recorded by the MLP, including the assessment, treatment plan, and disposition.  Victorian Gunn T Aragon Scarantino, MD

## 2010-12-08 LAB — CBC W/O DIFF
HCT: 42 % (ref 35.0–47.0)
HGB: 14.2 g/dL (ref 11.5–16.0)
MCH: 32.6 PG (ref 26.0–34.0)
MCHC: 33.8 g/dL (ref 30.0–36.5)
MCV: 96.6 FL (ref 80.0–99.0)
PLATELET: 281 10*3/uL (ref 150–400)
RBC: 4.35 M/uL (ref 3.80–5.20)
RDW: 13.8 % (ref 11.5–14.5)
WBC: 15 10*3/uL — ABNORMAL HIGH (ref 3.6–11.0)

## 2010-12-08 LAB — ETHYL ALCOHOL: ALCOHOL(ETHYL),SERUM: <10 MG/DL (ref <10)

## 2010-12-08 LAB — DRUG SCREEN, URINE
AMPHETAMINES: POSITIVE — AB
BARBITURATES: NEGATIVE
BENZODIAZEPINES: POSITIVE — AB
COCAINE: NEGATIVE
METHADONE: NEGATIVE
OPIATES: POSITIVE — AB
PCP(PHENCYCLIDINE): NEGATIVE
THC (TH-CANNABINOL): POSITIVE — AB

## 2010-12-08 LAB — METABOLIC PANEL, BASIC
Anion gap: 8 mmol/L (ref 5–15)
BUN/Creatinine ratio: 22 — ABNORMAL HIGH (ref 12–20)
BUN: 16 MG/DL (ref 6–20)
CO2: 26 MMOL/L (ref 21–32)
Calcium: 8.8 MG/DL (ref 8.5–10.1)
Chloride: 106 MMOL/L (ref 97–108)
Creatinine: 0.74 MG/DL (ref 0.45–1.15)
GFR est AA: >60 mL/min/{1.73_m2} (ref >60)
GFR est non-AA: >60 mL/min/{1.73_m2} (ref >60)
Glucose: 102 MG/DL — ABNORMAL HIGH (ref 65–100)
Potassium: 3.6 MMOL/L (ref 3.5–5.1)
Sodium: 140 MMOL/L (ref 136–145)

## 2010-12-08 LAB — URINALYSIS W/MICROSCOPIC
Bilirubin: NEGATIVE
Blood: NEGATIVE
Glucose: NEGATIVE MG/DL
Ketone: NEGATIVE MG/DL
Leukocyte Esterase: NEGATIVE
Nitrites: NEGATIVE
Specific gravity: >1.03 — ABNORMAL HIGH (ref 1.003–1.030)
Specific gravity: >1.03 — ABNORMAL HIGH (ref 1.003–1.030)
Urobilinogen: 0.2 EU/DL (ref 0.2–1.0)
pH (UA): 5.5 (ref 5.0–8.0)

## 2010-12-08 LAB — HCG URINE, QL: HCG urine, QL: NEGATIVE

## 2010-12-08 NOTE — ED Notes (Signed)
[facility] crisis at bedside talking with pt

## 2010-12-08 NOTE — ED Notes (Signed)
OK to d/c from ED per dr Kizzie Bane

## 2010-12-08 NOTE — ED Notes (Signed)
Per [employer] crisis and [employer], pt is no longer in custody from their stand point and free to be d/c; discussed with dr Kizzie Bane

## 2010-12-08 NOTE — ED Notes (Signed)
Received pt to exam room accompanied by [employer] for medical clearance, resting on stretcher in position of comfort, call bell given to pt

## 2010-12-08 NOTE — ED Provider Notes (Signed)
HPI Comments: Shannon [name] is a 35 y.o. female who presents restrained and escorted to the ED with CC of threatening to hurt herself with a knife this morning. The pt was brought here by [employer] and [employer] to be medically cleared to be able to live by herself. The pt states that her brother recently died, she drank for the first time in 7 years yesterday, and had a seizure. 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 The pt has a hx of depression, cutting her wrists, and suicide attempts. The pt states her tetanus is UTD. The pt has no complaints at this time and states she is otherwise healthy.    PCP: Shannon [name]  Allergies: Aspirin, Benadryl, Cipro, Darvocet, Ketorolac, PCN, Sulfa  Significant PMHx: Psuedo-seizures, Anxiety, cutting  Significant PSHx: Cholecystectomy, BTL  Social Hx: + tobacco, + alcohol, - illicit drugs    There are no other complaints, changes or physical findings at this time.   Written by Morley Kos, ED Scribe, as dictated by Tonie Griffith, MD    The history is provided by the patient and the police. No language interpreter was used.        Past Medical History   Diagnosis Date   ??? Seizures    ??? Arthritis    ??? Other ill-defined conditions      fibromyalgia   ??? Other ill-defined conditions      chronic ear disease    ??? Other ill-defined conditions      toxic mildew syndrome   ??? Other ill-defined conditions      lyme disease   ??? Other ill-defined conditions      degenerative disc   ??? Psychiatric disorder      PTSD, anxiety,cutting        Past Surgical History   Procedure Date   ??? Hx orthopaedic      left arm    ??? Hx cholecystectomy    ??? Hx heent    ??? Hx gyn      BTL         No family history on file.     History     Social History   ??? Marital Status: Legally Separated     Spouse Name: N/A     Number of Children: N/A   ??? Years of Education: N/A     Occupational History   ??? Not on file.     Social History Main Topics    ??? Smoking status: Current Everyday Smoker -- 0.5 packs/day   ??? Smokeless tobacco: Not on file   ??? Alcohol Use: Yes   ??? Drug Use: Yes     Special: Prescription   ??? Sexually Active: Yes -- Female partner(s)     Other Topics Concern   ??? Not on file     Social History Narrative   ??? No narrative on file                  ALLERGIES: Aspirin; Benadryl; Ciprofloxacin; Darvocet a500; Ketorolac; Pcn; and Sulfa(sulfonamide antibiotics)      Review of Systems   Constitutional: Negative.    HENT: Negative.    Eyes: Negative.    Respiratory: Negative.    Cardiovascular: Negative.    Gastrointestinal: Negative.    Genitourinary: Negative.    Musculoskeletal: Negative.    Skin: Negative.    Neurological: Negative.    Psychiatric/Behavioral: Positive for behavioral problems and self-injury.  All other systems reviewed and are negative.    Written by Morley Kos, ED Scribe, as dictated by Tonie Griffith, MD     Filed Vitals:    12/08/10 0421   BP: 117/70   Pulse: 61   Temp: 97.5 ??F (36.4 ??C)   Resp: 18   Height: 5\' 4"  (1.626 m)   Weight: 72.576 kg (160 lb)   SpO2: 98%            Physical Exam   Nursing note and vitals reviewed.  Constitutional: She is oriented to person, place, and time. She appears well-developed and well-nourished. No distress.   HENT:   Head: Normocephalic and atraumatic.   Mouth/Throat: Oropharynx is clear and moist.   Eyes: Conjunctivae and EOM are normal. Pupils are equal, round, and reactive to light.        Left peri-orbital ecchymosis     Neck: Normal range of motion. Neck supple. No JVD present. No tracheal deviation present.   Cardiovascular: Normal rate, regular rhythm, normal heart sounds and intact distal pulses.    No murmur heard.  Pulmonary/Chest: Effort normal and breath sounds normal. No stridor. No respiratory distress. She has no wheezes. She has no rales.   Musculoskeletal:        Superficial wounds to bil forearms      Neurological: She is alert and oriented to person, place, and time. No cranial nerve deficit.        No focal motor or sensory deficits     Skin: Skin is warm and dry. She is not diaphoretic.   Psychiatric:        Flat affect, angry          MDM     Amount and/or Complexity of Data Reviewed:   Clinical lab tests:  Ordered and reviewed   Decide to obtain previous medical records or to obtain history from someone other than the patient:  Yes   Obtain history from someone other than the patient:  Yes Diplomatic Services operational officer)   Review and summarize past medical records:  Yes      Procedures    PROGRESS NOTE  5:30 AM  [employer] [facility] feels that the pt can be discharged, and has contracted her for safety. She has an appointment with a psychiatrist today.  Written by Morley Kos, ED Scribe, as dictated by Tonie Griffith, MD    DISCHARGE NOTE  5:31 AM  Shannon [name]'s  results have been reviewed with her.  She has been counseled regarding her diagnosis.  She verbally conveys understanding and agreement of the signs, symptoms, diagnosis, treatment and prognosis and additionally agrees to follow up as recommended with her Psychiatrist today.  She also agrees with the care-plan and conveys that all of her questions have been answered.  I have also put together some discharge instructions for her that include: 1) educational information regarding their diagnosis, 2) how to care for their diagnosis at home, as well a 3) list of reasons why they would want to return to the ED prior to their follow-up appointment, should their condition change.    Written by Morley Kos, ED Scribe, as dictated by Tonie Griffith, MD

## 2011-02-02 LAB — METABOLIC PANEL, BASIC
Anion gap: 9 mmol/L (ref 5–15)
BUN/Creatinine ratio: 20 (ref 12–20)
BUN: 12 MG/DL (ref 6–20)
CO2: 25 MMOL/L (ref 21–32)
Calcium: 9.1 MG/DL (ref 8.5–10.1)
Chloride: 106 MMOL/L (ref 97–108)
Creatinine: 0.61 MG/DL (ref 0.45–1.15)
GFR est AA: >60 mL/min/{1.73_m2} (ref >60)
GFR est non-AA: >60 mL/min/{1.73_m2} (ref >60)
Glucose: 101 MG/DL — ABNORMAL HIGH (ref 65–100)
Potassium: 4.1 MMOL/L (ref 3.5–5.1)
Sodium: 140 MMOL/L (ref 136–145)

## 2011-02-02 LAB — CBC WITH AUTOMATED DIFF
ABS. BASOPHILS: 0 10*3/uL (ref 0.0–0.1)
ABS. EOSINOPHILS: 0 10*3/uL (ref 0.0–0.4)
ABS. LYMPHOCYTES: 3.2 10*3/uL (ref 0.8–3.5)
ABS. MONOCYTES: 0.7 10*3/uL (ref 0.0–1.0)
ABS. NEUTROPHILS: 8.2 10*3/uL — ABNORMAL HIGH (ref 1.8–8.0)
BASOPHILS: 0 % (ref 0–1)
EOSINOPHILS: 0 % (ref 0–7)
HCT: 46.9 % (ref 35.0–47.0)
HGB: 15.9 g/dL (ref 11.5–16.0)
LYMPHOCYTES: 26 % (ref 12–49)
MCH: 32.6 PG (ref 26.0–34.0)
MCHC: 33.9 g/dL (ref 30.0–36.5)
MCV: 96.1 FL (ref 80.0–99.0)
MONOCYTES: 6 % (ref 5–13)
NEUTROPHILS: 68 % (ref 32–75)
PLATELET: 392 10*3/uL (ref 150–400)
RBC: 4.88 M/uL (ref 3.80–5.20)
RDW: 13.7 % (ref 11.5–14.5)
WBC: 12.1 10*3/uL — ABNORMAL HIGH (ref 3.6–11.0)

## 2011-02-02 LAB — MAGNESIUM: Magnesium: 2.1 MG/DL (ref 1.6–2.4)

## 2011-02-02 MED ORDER — SODIUM CHLORIDE 0.9% BOLUS IV
0.9 % | INTRAVENOUS | Status: AC
Start: 2011-02-02 — End: 2011-02-02
  Administered 2011-02-02: 18:00:00 via INTRAVENOUS

## 2011-02-02 MED ORDER — MORPHINE 2 MG/ML INJECTION
2 mg/mL | INTRAMUSCULAR | Status: AC
Start: 2011-02-02 — End: 2011-02-02
  Administered 2011-02-02: 18:00:00 via INTRAVENOUS

## 2011-02-02 MED ORDER — MORPHINE 2 MG/ML INJECTION
2 mg/mL | INTRAMUSCULAR | Status: AC
Start: 2011-02-02 — End: 2011-02-02
  Administered 2011-02-02: 19:00:00 via INTRAVENOUS

## 2011-02-02 NOTE — ED Provider Notes (Signed)
HPI Comments: Shannon [name] is a 36 y.o. female who presents via EMS to [facility] ED with cc of 10/10 "throbbing" HA s/p seizure x today. Pt claims that she had a seizure today and has been having a throbbing HA since. Pt notes that her HA persisted and then she decided to call the rescue squad. Pt denies any chance of pregnancy and states that her LMP was "a couple weeks ago." Pt specifically denise CP, SOB, leg swelling, F/C, N/V/D, cough, congestion, sore throat, back pain, neck pain, ABD pain, or problems with urine or bowel function.    PCP: Phys Other, MD    ALL: ASA, Benadryl, Ciprofloxacin, Darvocet, Ketorolac, PCN, Sulfa ABX  PMhx is significant for: Seizures, Fibromyalgia  Surgical hx is significant for: none   Social hx: + Smoke (0.5 ppd), - EtOH    There are no other complaints, changes or physical findings at this time.  Written by Melchor Amour, ED Scribe, as dictated by Alden Benjamin, MD.      The history is provided by the patient.        Past Medical History   Diagnosis Date   ??? Seizures    ??? Other ill-defined conditions      fibromyalgia   ??? Other ill-defined conditions      chronic ear disease    ??? Other ill-defined conditions      toxic mildew syndrome   ??? Other ill-defined conditions      lyme disease   ??? Other ill-defined conditions      degenerative disc   ??? Psychiatric disorder      PTSD, anxiety,cutting   ??? Arthritis         Past Surgical History   Procedure Date   ??? Hx orthopaedic      left arm    ??? Hx cholecystectomy    ??? Hx heent    ??? Hx gyn      BTL         No family history on file.     History     Social History   ??? Marital Status: LEGALLY SEPARATED     Spouse Name: N/A     Number of Children: N/A   ??? Years of Education: N/A     Occupational History   ??? Not on file.     Social History Main Topics   ??? Smoking status: Current Everyday Smoker -- 0.5 packs/day   ??? Smokeless tobacco: Never Used   ??? Alcohol Use: No   ??? Drug Use: No   ??? Sexually Active: Not Currently -- Female partner(s)      Other Topics Concern   ??? Not on file     Social History Narrative   ??? No narrative on file                  ALLERGIES: Aspirin; Benadryl; Ciprofloxacin; Darvocet a500; Ketorolac; Pcn; and Sulfa(sulfonamide antibiotics)      Review of Systems   Constitutional: Negative.  Negative for fever and chills.   HENT: Negative for congestion, sore throat and neck pain.    Eyes: Negative.    Respiratory: Negative.  Negative for cough and shortness of breath.    Cardiovascular: Negative.  Negative for chest pain and leg swelling.   Gastrointestinal: Negative.  Negative for nausea, vomiting, abdominal pain, diarrhea, constipation and blood in stool.   Genitourinary: Negative.  Negative for dysuria, urgency, frequency, hematuria, enuresis and difficulty urinating.  Musculoskeletal: Negative.  Negative for back pain.   Skin: Negative.    Neurological: Positive for headaches (10/10 "throbbing" HA s/p seizure x today).   All other systems reviewed and are negative.        Filed Vitals:    02/02/11 1221 02/02/11 1230 02/02/11 1245   BP: 99/64 98/58 101/63   Pulse: 69 70 71   Temp: 98.1 ??F (36.7 ??C)     Resp: 20 16 13    Height: 5\' 4"  (1.626 m)     Weight: 68.04 kg (150 lb)     SpO2: 95% 100% 99%            Physical Exam   Nursing note and vitals reviewed.  Constitutional: She is oriented to person, place, and time. She appears well-developed and well-nourished. No distress.   HENT:   Mouth/Throat: Oropharynx is clear and moist.   Eyes: EOM are normal. Pupils are equal, round, and reactive to light.   Neck: Normal range of motion. Neck supple. No tracheal deviation present.   Cardiovascular: Normal rate, regular rhythm, normal heart sounds and intact distal pulses.  Exam reveals no gallop and no friction rub.    No murmur heard.  Pulmonary/Chest: Effort normal and breath sounds normal. No respiratory distress. She has no wheezes. She has no rales. She exhibits no tenderness.   Abdominal: Soft. She exhibits no distension and no mass.  There is no tenderness. There is no rebound and no guarding.   Musculoskeletal: Normal range of motion. She exhibits no edema.   Lymphadenopathy:     She has no cervical adenopathy.   Neurological: She is alert and oriented to person, place, and time. She has normal strength. No cranial nerve deficit.        No focal neuro deficit   Skin: Skin is warm and dry. No rash noted.   Psychiatric: She has a normal mood and affect.   Written by Melchor Amour, ED Scribe, as dictated by Alden Benjamin, MD.    MDM     Differential Diagnosis; Clinical Impression; Plan:     Pt with hx of seizures, fibromyalgia, chronic ear disease, and toxic mildew syndrome presents with HA s/p "subjective seizure" - appears fatigued. Basic labs to look hat Hgb and electrolytes. Pt will be treated with mediations for her HA.  Low suspicion for acute process.       Amount and/or Complexity of Data Reviewed:   Clinical lab tests:  Reviewed and ordered   Obtain history from someone other than the patient:  Yes   Review and summarize past medical records:  Yes      Procedures    LABORATORY TESTS:  Recent Results (from the past 12 hour(s))   CBC WITH AUTOMATED DIFF    Collection Time    02/02/11 12:50 PM       Component Value Range    WBC 12.1 (*) 3.6 - 11.0 K/uL    RBC 4.88  3.80 - 5.20 M/uL    HGB 15.9  11.5 - 16.0 g/dL    HCT 65.7  84.6 - 96.2 %    MCV 96.1  80.0 - 99.0 FL    MCH 32.6  26.0 - 34.0 PG    MCHC 33.9  30.0 - 36.5 g/dL    RDW 95.2  84.1 - 32.4 %    PLATELET 392  150 - 400 K/uL    NEUTROPHILS 68  32 - 75 %    LYMPHOCYTES 26  12 - 49 %    MONOCYTES 6  5 - 13 %    EOSINOPHILS 0  0 - 7 %    BASOPHILS 0  0 - 1 %    ABS. NEUTROPHILS 8.2 (*) 1.8 - 8.0 K/UL    ABS. LYMPHOCYTES 3.2  0.8 - 3.5 K/UL    ABS. MONOCYTES 0.7  0.0 - 1.0 K/UL    ABS. EOSINOPHILS 0.0  0.0 - 0.4 K/UL    ABS. BASOPHILS 0.0  0.0 - 0.1 K/UL   MAGNESIUM    Collection Time    02/02/11 12:50 PM       Component Value Range    Magnesium 2.1  1.6 - 2.4 MG/DL   METABOLIC PANEL,  BASIC    Collection Time    02/02/11 12:50 PM       Component Value Range    Sodium 140  136 - 145 MMOL/L    Potassium 4.1  3.5 - 5.1 MMOL/L    Chloride 106  97 - 108 MMOL/L    CO2 25  21 - 32 MMOL/L    Anion gap 9  5 - 15 mmol/L    Glucose 101 (*) 65 - 100 MG/DL    BUN 12  6 - 20 MG/DL    Creatinine 1.61  0.96 - 1.15 MG/DL    BUN/Creatinine ratio 20  12 - 20      GFR est AA >60  >60 ml/min/1.91m2    GFR est non-AA >60  >60 ml/min/1.55m2    Calcium 9.1  8.5 - 10.1 MG/DL       MEDICATIONS GIVEN:    Medications   pregabalin (LYRICA) 75 mg capsule (not administered)   sodium chloride 0.9 % bolus infusion 1,000 mL (1000 mL IntraVENous New Bag 02/02/11 1237)   morphine injection 4 mg (4 mg IntraVENous Given 02/02/11 1251)   morphine injection 4 mg (4 mg IntraVENous Given 02/02/11 1409)       IMPRESSION:  1. Seizure disorder    2. Headache        PLAN:  1. Neurology follow up  2. Continue medications    Return to ED if worse     3:02 PM  Vernadine Coombs Maffeo's  results have been reviewed with her.  She has been counseled regarding her diagnosis.  She verbally conveys understanding and agreement of the signs, symptoms, diagnosis, treatment and prognosis and additionally agrees to follow up as recommended with neurologist. in 24 - 48 hours.  She also agrees with the care-plan and conveys that all of her questions have been answered.  I have also put together some discharge instructions for her that include: 1) educational information regarding their diagnosis, 2) how to care for their diagnosis at home, as well a 3) list of reasons why they would want to return to the ED prior to their follow-up appointment, should their condition change.      Pt feels much better and is ready for d/c.

## 2011-02-02 NOTE — ED Notes (Signed)
Pt arrived via EMS stretcher. Pt c/o headache and seizure activity prior to arrival. Pt reports headache continues. Pt  Lethargic though A&OX3. Pt denies all other symptoms. Pt assisted into position of comfort. Call bell within reach.

## 2011-02-02 NOTE — ED Notes (Signed)
Pt c/o headache still 9/10. Discussed with Dr. Eulah Pont, verbal order given.

## 2011-02-02 NOTE — ED Notes (Signed)
Okay to call for transport per Luster Landsberg after speaking with charge and MD and Nurse Manager Tami

## 2011-02-02 NOTE — ED Notes (Signed)
[employer] called will send cab no ETA given.

## 2011-02-02 NOTE — ED Notes (Signed)
Pt requesting food and drink, Discussed with Dr. Eulah Pont, Regular tray ordered from dietary.

## 2011-02-02 NOTE — ED Notes (Signed)
Pt ambulatory to bathroom with a steady gait. Pt has no c/o dizziness, lightheadedness.

## 2011-02-02 NOTE — ED Notes (Signed)
Pt resting quietly with lights out for comfort.

## 2011-02-02 NOTE — ED Notes (Signed)
Assumed care of pt after receiving bedside report. Pt reports her ride will not be able to pick her up. Renee to speak with charge and MD.

## 2011-02-02 NOTE — ED Notes (Signed)
Spoke with Dr. Eulah Pont concerning pts BP. Medication given as ordered.

## 2011-02-02 NOTE — ED Notes (Addendum)
Bedside report and rounds completed with T. McGirt, RN. PT reporting "friend" not able to give her transportation home. Discussed with Charge nurse, D. Rice. Per Lupita Leash, pt may go [employer] cab if A&OX3 and ambulatory without difficulty. T. McGirt also made aware. T McGirt to clear with MD. Discussed with Dr. Eulah Pont. Dr. Eulah Pont states he is OK with pt going home in [place] cab.

## 2011-02-02 NOTE — ED Notes (Signed)
Tray delivered from dietary. PT eating.

## 2011-04-08 LAB — URINALYSIS W/ RFLX MICROSCOPIC
Bilirubin: NEGATIVE
Blood: NEGATIVE
Glucose: NEGATIVE MG/DL
Ketone: NEGATIVE MG/DL
Nitrites: NEGATIVE
Protein: NEGATIVE MG/DL
Specific gravity: 1.007 (ref 1.003–1.030)
Urobilinogen: 0.2 EU/DL (ref 0.2–1.0)
pH (UA): 6.5 (ref 5.0–8.0)

## 2011-04-08 LAB — CBC WITH AUTOMATED DIFF
ABS. BASOPHILS: 0.1 10*3/uL (ref 0.0–0.1)
ABS. EOSINOPHILS: 0.2 10*3/uL (ref 0.0–0.4)
ABS. LYMPHOCYTES: 4.5 10*3/uL — ABNORMAL HIGH (ref 0.8–3.5)
ABS. MONOCYTES: 0.8 10*3/uL (ref 0.0–1.0)
ABS. NEUTROPHILS: 5 10*3/uL (ref 1.8–8.0)
BASOPHILS: 1 % (ref 0–1)
EOSINOPHILS: 2 % (ref 0–7)
HCT: 46.7 % (ref 35.0–47.0)
HGB: 16.2 g/dL — ABNORMAL HIGH (ref 11.5–16.0)
LYMPHOCYTES: 43 % (ref 12–49)
MCH: 32.8 PG (ref 26.0–34.0)
MCHC: 34.7 g/dL (ref 30.0–36.5)
MCV: 94.5 FL (ref 80.0–99.0)
MONOCYTES: 7 % (ref 5–13)
NEUTROPHILS: 47 % (ref 32–75)
PLATELET: 244 10*3/uL (ref 150–400)
RBC: 4.94 M/uL (ref 3.80–5.20)
RDW: 14 % (ref 11.5–14.5)
WBC: 10.6 10*3/uL (ref 3.6–11.0)

## 2011-04-08 LAB — DRUG SCREEN, URINE
AMPHETAMINES: NEGATIVE
BARBITURATES: NEGATIVE
BENZODIAZEPINES: POSITIVE — AB
COCAINE: NEGATIVE
METHADONE: NEGATIVE
OPIATES: NEGATIVE
PCP(PHENCYCLIDINE): NEGATIVE
THC (TH-CANNABINOL): NEGATIVE

## 2011-04-08 LAB — POC CHEM8
Anion gap (POC): 14 mmol/L (ref 5–15)
BUN (POC): 9 MG/DL (ref 9–20)
CO2 (POC): 22 MMOL/L (ref 21–32)
Calcium, ionized (POC): 1.19 MMOL/L (ref 1.12–1.32)
Chloride (POC): 110 MMOL/L — ABNORMAL HIGH (ref 98–107)
Creatinine (POC): 0.7 MG/DL (ref 0.6–1.3)
GFRAA, POC: >60 mL/min/{1.73_m2} (ref >60)
GFRNA, POC: >60 mL/min/{1.73_m2} (ref >60)
Glucose (POC): 66 MG/DL (ref 65–105)
Hematocrit (POC): 47 % (ref 35.0–47.0)
Hemoglobin (POC): 16 GM/DL (ref 11.5–16.0)
Potassium (POC): 4.2 MMOL/L (ref 3.5–5.1)
Sodium (POC): 141 MMOL/L (ref 136–145)

## 2011-04-08 LAB — HCG URINE, QL: HCG urine, QL: NEGATIVE

## 2011-04-08 MED ORDER — MORPHINE 2 MG/ML INJECTION
2 mg/mL | INTRAMUSCULAR | Status: AC
Start: 2011-04-08 — End: 2011-04-08
  Administered 2011-04-08: 22:00:00 via INTRAVENOUS

## 2011-04-08 MED ORDER — ACETAMINOPHEN 325 MG TABLET
325 mg | Freq: Once | ORAL | Status: AC
Start: 2011-04-08 — End: 2011-04-08
  Administered 2011-04-08: 20:00:00 via ORAL

## 2011-04-08 MED ORDER — MORPHINE 2 MG/ML INJECTION
2 mg/mL | INTRAMUSCULAR | Status: DC
Start: 2011-04-08 — End: 2011-04-08

## 2011-04-08 MED ORDER — NITROFURANTOIN (25% MACROCRYSTAL FORM) 100 MG CAP
100 mg | ORAL_CAPSULE | Freq: Two times a day (BID) | ORAL | Status: AC
Start: 2011-04-08 — End: 2011-04-15

## 2011-04-08 MED ORDER — SODIUM CHLORIDE 0.9% BOLUS IV
0.9 % | INTRAVENOUS | Status: AC
Start: 2011-04-08 — End: 2011-04-08
  Administered 2011-04-08: 21:00:00 via INTRAVENOUS

## 2011-04-08 MED ORDER — SODIUM CHLORIDE 0.9% BOLUS IV
0.9 % | INTRAVENOUS | Status: AC
Start: 2011-04-08 — End: 2011-04-08
  Administered 2011-04-08: 20:00:00 via INTRAVENOUS

## 2011-04-08 MED ORDER — NITROFURANTOIN (25% MACROCRYSTAL FORM) 100 MG CAP
100 mg | ORAL | Status: AC
Start: 2011-04-08 — End: 2011-04-08
  Administered 2011-04-08: 21:00:00 via ORAL

## 2011-04-08 MED ORDER — BUTALBITAL-ACETAMINOPHEN-CAFFEINE 50 MG-325 MG-40 MG TAB
50-325-40 mg | ORAL | Status: DC
Start: 2011-04-08 — End: 2011-04-08

## 2011-04-08 MED ORDER — ONDANSETRON (PF) 4 MG/2 ML INJECTION
4 mg/2 mL | INTRAMUSCULAR | Status: AC
Start: 2011-04-08 — End: 2011-04-08
  Administered 2011-04-08: 22:00:00 via INTRAVENOUS

## 2011-04-08 MED FILL — MAPAP (ACETAMINOPHEN) 325 MG TABLET: 325 mg | ORAL | Qty: 2

## 2011-04-08 MED FILL — SODIUM CHLORIDE 0.9 % IV: INTRAVENOUS | Qty: 1000

## 2011-04-08 MED FILL — MACROBID 100 MG CAPSULE: 100 mg | ORAL | Qty: 1

## 2011-04-08 MED FILL — ONDANSETRON (PF) 4 MG/2 ML INJECTION: 4 mg/2 mL | INTRAMUSCULAR | Qty: 2

## 2011-04-08 MED FILL — MORPHINE 2 MG/ML INJECTION: 2 mg/mL | INTRAMUSCULAR | Qty: 1

## 2011-04-08 NOTE — ED Notes (Signed)
MD in to speak with pt. Security outside room.

## 2011-04-08 NOTE — ED Notes (Signed)
Pt medicated for pain and nausea. IVF infusing. Waiting for discharge papers.

## 2011-04-08 NOTE — ED Notes (Addendum)
pts family member arrived. Will notify MD when she is available. Pt continues to use call bell constantly.
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
## 2011-04-08 NOTE — ED Notes (Signed)
VSS. INT removed, bleeding controlled. Pt discharged by MD. Ambulates out of ER to home in stable condition with no new questions or complaints. Leaves with family member to take her home.

## 2011-04-08 NOTE — ED Notes (Signed)
Per MD, if pts BP improved and pt has ride present, she can receive 1 dose pain medicine prior to discharge. At this time, pt calling out, states she wants the nurse to talk on the phone to her ride who is "on the way but stuck in traffic". Informed pt that nobody is going to speak with him on the phone and that he needs to be here in person. Pt continues to call out frequently and yell at staff members when they walk by.

## 2011-04-08 NOTE — ED Notes (Signed)
Pt assisted to bedside commode. Tolerated well with no dizziness or complaint.

## 2011-04-08 NOTE — ED Notes (Signed)
Pt angry and argumentative that she is receiving tylenol PO and not a narcotic. States she takes 30 mg morphine at home, and that she gets it from pain management and is out and took last dose yesterday. Informed pt that #1 her BP is too low to give her anything other than tylenol #2 giving her a narcotic is a violation of her pain management contract and #3 she is groggy and lethargic and can barely keep her eyes open because of the amount of medications she has already had. Pt has now been informed by MD and RN that she cannot and will not receive any narcotics. Tried to refuse tylenol but agrees to take it stating "its not going to help, I already know that". Pt now calling out stating she wants her "walking papers" and wants to leave "since we wont help her". MD notified. States pt cannot leave at this time due to amount of sedatives she has taken and that her BP is low. Security called per MD request.

## 2011-04-11 LAB — TOPIRAMATE: Topiramate: NOT DETECTED ug/mL (ref 2.0–25.0)

## 2011-04-14 MED FILL — MIDAZOLAM 1 MG/ML IJ SOLN: 1 mg/mL | INTRAMUSCULAR | Qty: 5

## 2011-05-31 MED ORDER — OXYCODONE-ACETAMINOPHEN 5 MG-325 MG TAB
5-325 mg | ORAL_TABLET | Freq: Four times a day (QID) | ORAL | Status: DC | PRN
Start: 2011-05-31 — End: 2011-07-07

## 2011-05-31 MED ORDER — OXYCODONE-ACETAMINOPHEN 5 MG-325 MG TAB
5-325 mg | ORAL | Status: AC
Start: 2011-05-31 — End: 2011-05-31
  Administered 2011-05-31: 16:00:00 via ORAL

## 2011-05-31 MED FILL — OXYCODONE-ACETAMINOPHEN 5 MG-325 MG TAB: 5-325 mg | ORAL | Qty: 1
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
## 2011-05-31 NOTE — ED Notes (Signed)
Pt has been discharged by provider, left while this RN on lunch break. Pt was stable for discharge.

## 2011-07-07 MED ORDER — PREDNISONE 20 MG TAB
20 mg | ORAL_TABLET | Freq: Every day | ORAL | Status: AC
Start: 2011-07-07 — End: 2011-07-12

## 2011-07-07 MED ORDER — METOCLOPRAMIDE 5 MG/ML IJ SOLN
5 mg/mL | INTRAMUSCULAR | Status: AC
Start: 2011-07-07 — End: 2011-07-07
  Administered 2011-07-07: 21:00:00 via INTRAVENOUS

## 2011-07-07 MED ORDER — PROCHLORPERAZINE EDISYLATE 5 MG/ML INJECTION
5 mg/mL | INTRAMUSCULAR | Status: AC
Start: 2011-07-07 — End: 2011-07-07
  Administered 2011-07-07: 21:00:00 via INTRAVENOUS

## 2011-07-07 MED ORDER — SODIUM CHLORIDE 0.9 % IV
5005100 mg/5 mL (100 mg/mL) | INTRAVENOUS | Status: DC
Start: 2011-07-07 — End: 2011-07-07

## 2011-07-07 MED ORDER — SODIUM CHLORIDE 0.9 % IJ SYRG
INTRAMUSCULAR | Status: DC
Start: 2011-07-07 — End: 2011-07-07

## 2011-07-07 MED ORDER — BUTALBITAL-ACETAMINOPHEN-CAFFEINE 50 MG-325 MG-40 MG TAB
50-325-40 mg | ORAL_TABLET | ORAL | Status: DC | PRN
Start: 2011-07-07 — End: 2011-07-21

## 2011-07-07 MED ORDER — DEXAMETHASONE SODIUM PHOSPHATE 10 MG/ML IJ SOLN
10 mg/mL | INTRAMUSCULAR | Status: AC
Start: 2011-07-07 — End: 2011-07-07
  Administered 2011-07-07: 21:00:00 via INTRAVENOUS

## 2011-07-07 MED ORDER — DIAZEPAM 5 MG/ML SYRINGE
5 mg/mL | INTRAMUSCULAR | Status: AC
Start: 2011-07-07 — End: 2011-07-07
  Administered 2011-07-07: 21:00:00 via INTRAVENOUS

## 2011-07-07 MED ORDER — DIAZEPAM 5 MG TAB
5 mg | ORAL_TABLET | Freq: Three times a day (TID) | ORAL | Status: DC | PRN
Start: 2011-07-07 — End: 2011-07-21

## 2011-07-07 MED FILL — DIAZEPAM 5 MG/ML SYRINGE: 5 mg/mL | INTRAMUSCULAR | Qty: 2

## 2011-07-07 MED FILL — PROCHLORPERAZINE EDISYLATE 5 MG/ML INJECTION: 5 mg/mL | INTRAMUSCULAR | Qty: 2

## 2011-07-07 MED FILL — DEXAMETHASONE SODIUM PHOSPHATE 10 MG/ML IJ SOLN: 10 mg/mL | INTRAMUSCULAR | Qty: 1

## 2011-07-07 MED FILL — METOCLOPRAMIDE 5 MG/ML IJ SOLN: 5 mg/mL | INTRAMUSCULAR | Qty: 2

## 2011-07-07 MED FILL — SALINE FLUSH INJECTION SYRINGE: INTRAMUSCULAR | Qty: 40

## 2011-07-07 MED FILL — VALPROATE SODIUM 100 MG/ML IV: 500 mg/5 mL (100 mg/mL) | INTRAVENOUS | Qty: 5

## 2011-07-07 NOTE — ED Notes (Addendum)
Care of pt assumed at this time in room 40. Initial contact made with pt. Pt here frequently for various complaints that always end with her demanding pain medicine. Pt appears groggy with slurred speech. This RN has taken care of her multiple times before. Pt frequently arrives to ER after taking medications at home and presents like this. Also has low BP, systolic in 90's. Today pt here for pain for days, and N/V earlier. No vomiting noted since arriving to ER. Pt requesting pain medicine. Aware she must be seen by MD. Family member at side. Also noted that pt is member of pain management.

## 2011-07-07 NOTE — ED Notes (Signed)
Pt refusing to take depacon stating, "my doctor told me that i can't take that with the trileptal.  If i take that i'll end up going to the ICU." Dr. Vear Clock aware.

## 2011-07-07 NOTE — ED Notes (Signed)
Pt asleep.  Breathing nonlabored and even.

## 2011-07-07 NOTE — ED Provider Notes (Signed)
HPI Comments: Shannon [name] is a 36 y.o. female who presents ambulatory to [facility] ED with cc of 10/10 frontal HA x 07/04/11 that is exacerbated by light and alleviated by nothing. Pt reports associated sxs of neck pain and nausea. Pt reports she has hx of migraines and notes her current sxs feel similar. Pt reports she takes Topamax daily for migraines but reports no relief. Pt denies vomiting, diarrhea, CP, abd pain or SOB.    PCP: [name], NP    PMhx is significant for: PTSD, Anxiety, Cutting, Arthritis, Fibromyalgia, Lyme Disease, DDD  SMhx is significant for: Cholecystectomy  Social hx: + Smoke, - EtOH, - Drugs    There are no other complaints, changes or physical findings at this time. 4:30 PM   Written by Sherlyn Lees, ED Scribe, as dictated by Inge Rise,*.        The history is provided by the patient.        Past Medical History   Diagnosis Date   ??? Seizures    ??? Psychiatric disorder      PTSD, anxiety,cutting   ??? Arthritis    ??? Other ill-defined conditions      fibromyalgia   ??? Other ill-defined conditions      chronic ear disease    ??? Other ill-defined conditions      toxic mildew syndrome   ??? Other ill-defined conditions      lyme disease   ??? Other ill-defined conditions      degenerative disc        Past Surgical History   Procedure Date   ??? Hx orthopaedic      left arm    ??? Hx cholecystectomy    ??? Hx heent    ??? Hx gyn      BTL         No family history on file.     History     Social History   ??? Marital Status: LEGALLY SEPARATED     Spouse Name: N/A     Number of Children: N/A   ??? Years of Education: N/A     Occupational History   ??? Not on file.     Social History Main Topics   ??? Smoking status: Current Everyday Smoker -- 0.5 packs/day   ??? Smokeless tobacco: Never Used   ??? Alcohol Use: No   ??? Drug Use: No   ??? Sexually Active: Not Currently -- Female partner(s)     Other Topics Concern   ??? Not on file     Social History Narrative   ??? No narrative on file                   ALLERGIES: Aspirin; Benadryl; Ciprofloxacin; Darvocet a500; Ketorolac; Pcn; and Sulfa (sulfonamide antibiotics)      Review of Systems   Constitutional: Negative.    HENT: Positive for neck pain.    Eyes: Positive for photophobia.   Respiratory: Negative.  Negative for shortness of breath.    Cardiovascular: Negative.  Negative for chest pain.   Gastrointestinal: Positive for nausea. Negative for vomiting, abdominal pain and diarrhea.   Genitourinary: Negative.    Skin: Negative.    Neurological: Positive for headaches.   Hematological: Negative.    Psychiatric/Behavioral: Negative.    All other systems reviewed and are negative.        Filed Vitals:    07/07/11 1303 07/07/11 1713 07/07/11 1715 07/07/11 1735  BP: 97/74 104/63  100/60   Pulse: 65   60   Temp: 98.3 ??F (36.8 ??C)      Resp: 18   16   Height: 5\' 4"  (1.626 m)      Weight: 64.3 kg (141 lb 12.1 oz)      SpO2: 98%  97% 98%            Physical Exam   Nursing note and vitals reviewed.  CONSTITUTIONAL: Appears to be in pain; well-nourished; in no apparent distress  HEAD: Normocephalic; atraumatic  EYES: conjunctiva and sclera are clear bilaterally.  ENT: no rhinorrhea; normal pharynx with no tonsillar hypertrophy; mucous membranes pink/moist, no erythema, no exudate.  NECK: Supple; non-tender; no cervical lymphadenopathy  CARD: Normal S1, S2; no murmurs, rubs, or gallops. Regular rate and rhythm.  RESP: Normal respiratory effort; breath sounds clear and equal bilaterally; no wheezes, rhonchi, or rales.  ABD: Normal bowel sounds; non-distended; non-tender; no palpable organomegaly, no masses, no bruits.  EXT: Normal ROM in all four extremities; non-tender to palpation; distal pulses are normal, no edema. Paravertebral R sided muscle spasm and tenderness  SKIN: Normal for age and race; warm; dry; no apparent lesions, rashes or ulcers.  Written by Andreas Blower, ED Scribe, as dictated by Inge Rise,*.    MDM     Differential Diagnosis; Clinical  Impression; Plan:     DDx: Migraine vs Muscle Spasm vs Tension HA  Amount and/or Complexity of Data Reviewed:    Review and summarize past medical records:  Yes  Progress:   Patient progress:  Stable      Procedures      MEDICATIONS GIVEN:    Medications   valproate (DEPACON) 500 mg in 0.9% sodium chloride 50 mL IVPB (500 mg IntraVENous Refused 07/07/11 1634)   sodium chloride (NS) 0.9 % flush (not administered)   butalbital-acetaminophen-caffeine (FIORICET) 50-325-40 mg per tablet (not administered)   diazepam (VALIUM) 5 mg tablet (not administered)   predniSONE (DELTASONE) 20 mg tablet (not administered)   diazepam (VALIUM) injection 5 mg (5 mg IntraVENous Given 07/07/11 1708)   dexamethasone (DECADRON) injection 10 mg (10 mg IntraVENous Given 07/07/11 1701)   metoclopramide HCl (REGLAN) injection 10 mg (10 mg IntraVENous Given 07/07/11 1702)   prochlorperazine (COMPAZINE) injection 10 mg (10 mg IntraVENous Given 07/07/11 1705)       IMPRESSION:  1. Headache    2. Muscle spasm        PLAN:  1. Fioricet, Valium, Deltasone  2. F/u with PCP  Return to ED if worse     DISCHARGE NOTE  5:53 PM    Shannon [name] is ready for discharge. The pt's signs/symptoms, results, diagnosis and discharge instructions have been discussed w/ the pt and/or available family members. The pt conveys understanding and agreement with the diagnosis and plan. There are no new physical findings, changes or complaints at this time. Pt is recommended to f/u with PCP or return to ED if condition worsens. Will discharge w/ Fioricet, Valium, Deltasone.  Written by Andreas Blower, ED Scribe, as dictated by Inge Rise

## 2011-07-07 NOTE — ED Notes (Signed)
Pt calling out on callbell, wants to know how much longer she has to wait to leave. Informed we have to monitor her for a short time after receiving the medications she was given. Demonstrates understanding.

## 2011-07-07 NOTE — ED Notes (Signed)
Pt has been on call bell multiple times, asking for pain medicine. Aware she is waiting to be seen by provider. Pt also has her family member (grandfather?) stand in hallway and ask staff as they walk by and come up to nurses station requesting pain medicine and ice chips. Both are aware she cannot have anything to eat or drink, and has no orders for pain medicine.
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
## 2011-07-07 NOTE — ED Notes (Signed)
Pt again on callbell asking when she can leave. Informed that MD is aware she wants to leave and will discharge her when she is ready for pt to be discharged.

## 2011-07-21 LAB — CK W/ CKMB & INDEX
CK - MB: 2.8 NG/ML (ref 0.5–3.6)
CK-MB Index: 0.8 (ref 0–2.5)
CK: 359 U/L — ABNORMAL HIGH (ref 26–192)

## 2011-07-21 LAB — EKG, 12 LEAD, INITIAL
Atrial Rate: 73 {beats}/min
Calculated P Axis: 61 degrees
Calculated R Axis: 64 degrees
Calculated T Axis: 42 degrees
Diagnosis: NORMAL
P-R Interval: 150 ms
Q-T Interval: 416 ms
QRS Duration: 78 ms
QTC Calculation (Bezet): 458 ms
Ventricular Rate: 73 {beats}/min

## 2011-07-21 LAB — METABOLIC PANEL, COMPREHENSIVE
A-G Ratio: 1.1 (ref 1.1–2.2)
ALT (SGPT): 94 U/L — ABNORMAL HIGH (ref 12–78)
AST (SGOT): 151 U/L — ABNORMAL HIGH (ref 15–37)
Albumin: 3.3 g/dL — ABNORMAL LOW (ref 3.5–5.0)
Alk. phosphatase: 84 U/L (ref 50–136)
Anion gap: 11 mmol/L (ref 5–15)
BUN/Creatinine ratio: 22 — ABNORMAL HIGH (ref 12–20)
BUN: 14 MG/DL (ref 6–20)
Bilirubin, total: 0.8 MG/DL (ref 0.2–1.0)
CO2: 21 MMOL/L (ref 21–32)
Calcium: 7.9 MG/DL — ABNORMAL LOW (ref 8.5–10.1)
Chloride: 109 MMOL/L — ABNORMAL HIGH (ref 97–108)
Creatinine: 0.65 MG/DL (ref 0.45–1.15)
GFR est AA: >60 mL/min/{1.73_m2} (ref >60)
GFR est non-AA: >60 mL/min/{1.73_m2} (ref >60)
Globulin: 3 g/dL (ref 2.0–4.0)
Glucose: 100 MG/DL (ref 65–100)
Potassium: 3.2 MMOL/L — ABNORMAL LOW (ref 3.5–5.1)
Protein, total: 6.3 g/dL — ABNORMAL LOW (ref 6.4–8.2)
Sodium: 141 MMOL/L (ref 136–145)

## 2011-07-21 LAB — PTT: aPTT: 26.4 s (ref 23.0–30.0)

## 2011-07-21 LAB — URINALYSIS W/ RFLX MICROSCOPIC
Bilirubin: NEGATIVE
Blood: NEGATIVE
Glucose: NEGATIVE MG/DL
Ketone: NEGATIVE MG/DL
Leukocyte Esterase: NEGATIVE
Nitrites: NEGATIVE
Protein: NEGATIVE MG/DL
Specific gravity: 1.005 (ref 1.003–1.030)
Urobilinogen: 0.2 EU/DL (ref 0.2–1.0)
pH (UA): 6.5 (ref 5.0–8.0)

## 2011-07-21 LAB — PROTHROMBIN TIME + INR
INR: 1.1 (ref 0.9–1.1)
Prothrombin time: 11 s (ref 9.4–11.7)

## 2011-07-21 LAB — CBC WITH AUTOMATED DIFF
ABS. BASOPHILS: 0 10*3/uL (ref 0.0–0.1)
ABS. EOSINOPHILS: 0 10*3/uL (ref 0.0–0.4)
ABS. LYMPHOCYTES: 5.5 10*3/uL — ABNORMAL HIGH (ref 0.8–3.5)
ABS. MONOCYTES: 1.2 10*3/uL — ABNORMAL HIGH (ref 0.0–1.0)
ABS. NEUTROPHILS: 8.9 10*3/uL — ABNORMAL HIGH (ref 1.8–8.0)
BASOPHILS: 0 % (ref 0–1)
EOSINOPHILS: 0 % (ref 0–7)
HCT: 37.5 % (ref 35.0–47.0)
HGB: 12.8 g/dL (ref 11.5–16.0)
LYMPHOCYTES: 35 % (ref 12–49)
MCH: 33 PG (ref 26.0–34.0)
MCHC: 34.1 g/dL (ref 30.0–36.5)
MCV: 96.6 FL (ref 80.0–99.0)
MONOCYTES: 8 % (ref 5–13)
NEUTROPHILS: 57 % (ref 32–75)
PLATELET: 195 10*3/uL (ref 150–400)
RBC: 3.88 M/uL (ref 3.80–5.20)
RDW: 13.8 % (ref 11.5–14.5)
WBC: 15.6 10*3/uL — ABNORMAL HIGH (ref 3.6–11.0)

## 2011-07-21 LAB — DRUG SCREEN, URINE
AMPHETAMINES: NEGATIVE
BARBITURATES: NEGATIVE
BENZODIAZEPINES: NEGATIVE
COCAINE: NEGATIVE
METHADONE: NEGATIVE
OPIATES: POSITIVE — AB
PCP(PHENCYCLIDINE): NEGATIVE
THC (TH-CANNABINOL): POSITIVE — AB

## 2011-07-21 LAB — ACETAMINOPHEN: Acetaminophen level: <2 ug/mL — ABNORMAL LOW (ref 10–30)

## 2011-07-21 LAB — HCG URINE, QL: HCG urine, QL: NEGATIVE

## 2011-07-21 LAB — AMMONIA: Ammonia, plasma: 44 umol/L — ABNORMAL HIGH (ref <32)

## 2011-07-21 MED ORDER — DIAZEPAM 5 MG TAB
5 mg | Freq: Three times a day (TID) | ORAL | Status: DC
Start: 2011-07-21 — End: 2011-07-23
  Administered 2011-07-22 – 2011-07-23 (×6): via ORAL

## 2011-07-21 MED ORDER — MORPHINE 2 MG/ML INJECTION
2 mg/mL | INTRAMUSCULAR | Status: DC | PRN
Start: 2011-07-21 — End: 2011-07-22
  Administered 2011-07-21 – 2011-07-22 (×4): via INTRAVENOUS

## 2011-07-21 MED ORDER — CITALOPRAM 20 MG TAB
20 mg | Freq: Every day | ORAL | Status: DC
Start: 2011-07-21 — End: 2011-07-23
  Administered 2011-07-22 – 2011-07-23 (×2): via ORAL

## 2011-07-21 MED ORDER — ONDANSETRON (PF) 4 MG/2 ML INJECTION
4 mg/2 mL | INTRAMUSCULAR | Status: DC | PRN
Start: 2011-07-21 — End: 2011-07-23

## 2011-07-21 MED ORDER — MORPHINE 10 MG/5 ML ORAL SOLN
10 mg/5 mL | Freq: Once | ORAL | Status: AC
Start: 2011-07-21 — End: 2011-07-21
  Administered 2011-07-21: 14:00:00 via ORAL

## 2011-07-21 MED ORDER — SODIUM CHLORIDE 0.9 % IV
INTRAVENOUS | Status: DC
Start: 2011-07-21 — End: 2011-07-23
  Administered 2011-07-21 – 2011-07-23 (×4): via INTRAVENOUS

## 2011-07-21 MED ORDER — ENOXAPARIN 40 MG/0.4 ML SUB-Q SYRINGE
40 mg/0.4 mL | SUBCUTANEOUS | Status: DC
Start: 2011-07-21 — End: 2011-07-23
  Administered 2011-07-21 – 2011-07-22 (×2): via SUBCUTANEOUS

## 2011-07-21 MED ORDER — OXCARBAZEPINE 300 MG TAB
300 mg | ORAL | Status: AC
Start: 2011-07-21 — End: 2011-07-21
  Administered 2011-07-21: 14:00:00 via ORAL

## 2011-07-21 MED ORDER — SODIUM CHLORIDE 0.9% BOLUS IV
0.9 % | Freq: Once | INTRAVENOUS | Status: AC
Start: 2011-07-21 — End: 2011-07-21
  Administered 2011-07-21: 15:00:00 via INTRAVENOUS

## 2011-07-21 MED ORDER — DOCUSATE SODIUM 100 MG CAP
100 mg | Freq: Two times a day (BID) | ORAL | Status: DC
Start: 2011-07-21 — End: 2011-07-23
  Administered 2011-07-21 – 2011-07-23 (×4): via ORAL

## 2011-07-21 MED ORDER — ACETYLCYSTEINE 20 % (200 MG/ML) IV SOLN
200 mg/mL (20 %) | INTRAVENOUS | Status: DC
Start: 2011-07-21 — End: 2011-07-21

## 2011-07-21 MED ORDER — TOPIRAMATE 100 MG TAB
100 mg | Freq: Two times a day (BID) | ORAL | Status: DC
Start: 2011-07-21 — End: 2011-07-23
  Administered 2011-07-22 – 2011-07-23 (×5): via ORAL

## 2011-07-21 MED ORDER — ACETYLCYSTEINE 20 % (200 MG/ML) SOLN
200 mg/mL (20 %) | Status: AC
Start: 2011-07-21 — End: 2011-07-21
  Administered 2011-07-21: 19:00:00 via ORAL

## 2011-07-21 MED ORDER — SODIUM CHLORIDE 0.9 % IJ SYRG
Freq: Three times a day (TID) | INTRAMUSCULAR | Status: DC
Start: 2011-07-21 — End: 2011-07-23
  Administered 2011-07-22 – 2011-07-23 (×6): via INTRAVENOUS

## 2011-07-21 MED ORDER — MORPHINE 15 MG TAB
15 mg | ORAL | Status: DC | PRN
Start: 2011-07-21 — End: 2011-07-21

## 2011-07-21 MED ORDER — MORPHINE ER 30 MG TAB
30 mg | Freq: Two times a day (BID) | ORAL | Status: DC
Start: 2011-07-21 — End: 2011-07-23
  Administered 2011-07-22 – 2011-07-23 (×4): via ORAL

## 2011-07-21 MED ORDER — DEXTROSE 5% IN WATER (D5W) IV
200 mg/mL (20 %) | Freq: Once | INTRAVENOUS | Status: DC
Start: 2011-07-21 — End: 2011-07-21
  Administered 2011-07-21: 20:00:00 via INTRAVENOUS

## 2011-07-21 MED ORDER — SODIUM CHLORIDE 0.9 % IJ SYRG
INTRAMUSCULAR | Status: DC | PRN
Start: 2011-07-21 — End: 2011-07-23

## 2011-07-21 MED ORDER — ZOLPIDEM 5 MG TAB
5 mg | Freq: Every evening | ORAL | Status: DC | PRN
Start: 2011-07-21 — End: 2011-07-23
  Administered 2011-07-22 – 2011-07-23 (×2): via ORAL

## 2011-07-21 MED ORDER — SODIUM CHLORIDE 0.9 % IJ SYRG
Freq: Three times a day (TID) | INTRAMUSCULAR | Status: DC
Start: 2011-07-21 — End: 2011-07-23
  Administered 2011-07-21 – 2011-07-23 (×7): via INTRAVENOUS

## 2011-07-21 MED ORDER — OXCARBAZEPINE 300 MG TAB
300 mg | Freq: Two times a day (BID) | ORAL | Status: DC
Start: 2011-07-21 — End: 2011-07-23
  Administered 2011-07-22 – 2011-07-23 (×4): via ORAL

## 2011-07-21 MED ORDER — DIPHTH,PERTUS(AC)TETANUS VAC(PF) 2.5 LF UNIT-8 MCG-5 LF/0.5 ML INJ
INTRAMUSCULAR | Status: DC
Start: 2011-07-21 — End: 2011-07-21

## 2011-07-21 MED ORDER — NICOTINE 7 MG/24 HR DAILY PATCH
7 mg/24 hr | TRANSDERMAL | Status: DC
Start: 2011-07-21 — End: 2011-07-23

## 2011-07-21 MED ORDER — TOPIRAMATE 100 MG TAB
100 mg | ORAL | Status: AC
Start: 2011-07-21 — End: 2011-07-21
  Administered 2011-07-21: 14:00:00 via ORAL

## 2011-07-21 MED ORDER — ACETYLCYSTEINE 20 % (200 MG/ML) SOLN
200 mg/mL (20 %) | Status: AC
Start: 2011-07-21 — End: 2011-07-22
  Administered 2011-07-22 (×4): via ORAL

## 2011-07-21 MED ORDER — IBUPROFEN 400 MG TAB
400 mg | Freq: Four times a day (QID) | ORAL | Status: DC | PRN
Start: 2011-07-21 — End: 2011-07-23
  Administered 2011-07-21 – 2011-07-23 (×6): via ORAL

## 2011-07-21 MED ORDER — PREGABALIN 100 MG CAP
100 mg | Freq: Three times a day (TID) | ORAL | Status: DC
Start: 2011-07-21 — End: 2011-07-23
  Administered 2011-07-21 – 2011-07-23 (×7): via ORAL

## 2011-07-21 MED FILL — IBUPROFEN 400 MG TAB: 400 mg | ORAL | Qty: 1

## 2011-07-21 MED FILL — LOVENOX 40 MG/0.4 ML SUBCUTANEOUS SYRINGE: 40 mg/0.4 mL | SUBCUTANEOUS | Qty: 0.4

## 2011-07-21 MED FILL — ACETADOTE 200 MG/ML (20 %) INTRAVENOUS SOLUTION: 200 mg/mL (20 %) | INTRAVENOUS | Qty: 17

## 2011-07-21 MED FILL — MORPHINE 10 MG/5 ML ORAL SOLN: 10 mg/5 mL | ORAL | Qty: 15

## 2011-07-21 MED FILL — ACETYLCYSTEINE 20 % (200 MG/ML) SOLN: 200 mg/mL (20 %) | Qty: 30

## 2011-07-21 MED FILL — MORPHINE 2 MG/ML INJECTION: 2 mg/mL | INTRAMUSCULAR | Qty: 1

## 2011-07-21 MED FILL — DOK 100 MG CAPSULE: 100 mg | ORAL | Qty: 1

## 2011-07-21 MED FILL — ACETYLCYSTEINE 20 % (200 MG/ML) SOLN: 200 mg/mL (20 %) | Qty: 60

## 2011-07-21 MED FILL — SODIUM CHLORIDE 0.9 % IV: INTRAVENOUS | Qty: 1000

## 2011-07-21 MED FILL — TOPIRAMATE 100 MG TAB: 100 mg | ORAL | Qty: 2

## 2011-07-21 MED FILL — OXCARBAZEPINE 300 MG TAB: 300 mg | ORAL | Qty: 1

## 2011-07-21 MED FILL — NICOTINE 7 MG/24 HR DAILY PATCH: 7 mg/24 hr | TRANSDERMAL | Qty: 1

## 2011-07-21 MED FILL — ACETADOTE 200 MG/ML (20 %) INTRAVENOUS SOLUTION: 200 mg/mL (20 %) | INTRAVENOUS | Qty: 51

## 2011-07-21 NOTE — ED Notes (Signed)
Pt is in x-ray.
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
## 2011-07-21 NOTE — ED Notes (Signed)
Dietary contacted and lunch tray ordered
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
## 2011-07-21 NOTE — ED Notes (Signed)
Discussed pt's BP with Dr. Eulah Pont.  Pt is drowsy but arousable to name. Denies any new abd pain but states that she is hungry.  Pt to be NPO at this time.

## 2011-07-21 NOTE — ED Notes (Signed)
Pt states that her voice is normally not this hoarse.

## 2011-07-21 NOTE — ED Notes (Signed)
Pt. Taken to 3101 via w/c with D. Epps PCT in no acute distress.

## 2011-07-21 NOTE — ED Notes (Signed)
Forensics nurse, Kendal Hymen, called and is responding to hospital.

## 2011-07-21 NOTE — ED Notes (Signed)
Pt is awake and alert.  Talking on phone.

## 2011-07-21 NOTE — Progress Notes (Signed)
Bedside and Verbal shift change report given to jennifer taylor rn (oncoming nurse) by yvonne slade rn (offgoing nurse).  Report given with SBAR, Kardex, MAR and Recent Results.
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
## 2011-07-21 NOTE — ED Notes (Signed)
Meal tray given to patient.  Pt eating without difficulty.  Forensics has left.  Pt is awake and alert.  Lights off in room for comfort.  Pt to be admitted.

## 2011-07-21 NOTE — ED Notes (Signed)
[facility] contacted at [phone]
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
## 2011-07-21 NOTE — ED Notes (Signed)
Dr. Eulah Pont to bedside and updated patient on plan of care.
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
## 2011-07-21 NOTE — Other (Signed)
TRANSFER - OUT REPORT:    Verbal report given to [facility]) on Shannon [name]  being transferred to 3101(unit) for routine progression of care       Report consisted of patient???s Situation, Background, Assessment and   Recommendations(SBAR).     Information from the following report(s) ED Summary was reviewed with the receiving nurse.    Opportunity for questions and clarification was provided.
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
## 2011-07-21 NOTE — ED Notes (Signed)
IT trainer at bedside with patient along with forensics.

## 2011-07-21 NOTE — ED Notes (Signed)
Pt is very drowsy.  Pt connected to monitor x3.  Pt started on 2L of O2 via NC due being drowsy and O2 sats in the low 90's.

## 2011-07-21 NOTE — ED Notes (Signed)
Pt to [place] via stretcher.  Pt is agitated and states that "I have been patient, but you have not been giving me my medications."  Pt is asking when she can have pain medication again.  Pt is requesting her lyrica and valium.
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
## 2011-07-22 LAB — METABOLIC PANEL, COMPREHENSIVE
A-G Ratio: 1 — ABNORMAL LOW (ref 1.1–2.2)
ALT (SGPT): 139 U/L — ABNORMAL HIGH (ref 12–78)
AST (SGOT): 146 U/L — ABNORMAL HIGH (ref 15–37)
Albumin: 3.2 g/dL — ABNORMAL LOW (ref 3.5–5.0)
Alk. phosphatase: 116 U/L (ref 50–136)
Anion gap: 11 mmol/L (ref 5–15)
BUN/Creatinine ratio: 22 — ABNORMAL HIGH (ref 12–20)
BUN: 14 MG/DL (ref 6–20)
Bilirubin, total: 1.1 MG/DL — ABNORMAL HIGH (ref 0.2–1.0)
CO2: 22 MMOL/L (ref 21–32)
Calcium: 8.1 MG/DL — ABNORMAL LOW (ref 8.5–10.1)
Chloride: 110 MMOL/L — ABNORMAL HIGH (ref 97–108)
Creatinine: 0.63 MG/DL (ref 0.45–1.15)
GFR est AA: >60 mL/min/{1.73_m2} (ref >60)
GFR est non-AA: >60 mL/min/{1.73_m2} (ref >60)
Globulin: 3.2 g/dL (ref 2.0–4.0)
Glucose: 91 MG/DL (ref 65–100)
Potassium: 3.7 MMOL/L (ref 3.5–5.1)
Protein, total: 6.4 g/dL (ref 6.4–8.2)
Sodium: 143 MMOL/L (ref 136–145)

## 2011-07-22 LAB — CBC WITH AUTOMATED DIFF
ABS. BASOPHILS: 0 10*3/uL (ref 0.0–0.1)
ABS. EOSINOPHILS: 0.4 10*3/uL (ref 0.0–0.4)
ABS. LYMPHOCYTES: 6 10*3/uL — ABNORMAL HIGH (ref 0.8–3.5)
ABS. MONOCYTES: 1 10*3/uL (ref 0.0–1.0)
ABS. NEUTROPHILS: 5.3 10*3/uL (ref 1.8–8.0)
BAND NEUTROPHILS: 1 % (ref 0–6)
BASOPHILS: 0 % (ref 0–1)
EOSINOPHILS: 3 % (ref 0–7)
HCT: 37.2 % (ref 35.0–47.0)
HGB: 12.7 g/dL (ref 11.5–16.0)
LYMPHOCYTES: 47 % (ref 12–49)
MCH: 32.8 PG (ref 26.0–34.0)
MCHC: 34.1 g/dL (ref 30.0–36.5)
MCV: 96.1 FL (ref 80.0–99.0)
MONOCYTES: 8 % (ref 5–13)
NEUTROPHILS: 41 % (ref 32–75)
PLATELET: 194 10*3/uL (ref 150–400)
RBC: 3.87 M/uL (ref 3.80–5.20)
RDW: 14 % (ref 11.5–14.5)
WBC: 12.7 10*3/uL — ABNORMAL HIGH (ref 3.6–11.0)

## 2011-07-22 LAB — AMMONIA: Ammonia, plasma: 67 umol/L — ABNORMAL HIGH (ref <32)

## 2011-07-22 LAB — PROTHROMBIN TIME + INR
INR: 1 (ref 0.9–1.1)
Prothrombin time: 10.6 s (ref 9.4–11.7)

## 2011-07-22 LAB — PTT: aPTT: 29.3 s (ref 23.0–30.0)

## 2011-07-22 MED ORDER — FOLIC ACID 1 MG TAB
1 mg | Freq: Every day | ORAL | Status: DC
Start: 2011-07-22 — End: 2011-07-23
  Administered 2011-07-23: 12:00:00 via ORAL

## 2011-07-22 MED ORDER — SODIUM CHLORIDE 0.9 % IV
100 mg/mL | Freq: Every day | INTRAVENOUS | Status: DC
Start: 2011-07-22 — End: 2011-07-23
  Administered 2011-07-23: 13:00:00 via INTRAVENOUS

## 2011-07-22 MED ORDER — THERAPEUTIC MULTIVITAMIN ORAL LIQUID
Freq: Every day | ORAL | Status: DC
Start: 2011-07-22 — End: 2011-07-23
  Administered 2011-07-23: 12:00:00 via ORAL

## 2011-07-22 MED ORDER — THIAMINE 100 MG/ML INJECTION
100 mg/mL | Freq: Every day | INTRAMUSCULAR | Status: DC
Start: 2011-07-22 — End: 2011-07-22

## 2011-07-22 MED ORDER — MORPHINE 2 MG/ML INJECTION
2 mg/mL | INTRAMUSCULAR | Status: DC | PRN
Start: 2011-07-22 — End: 2011-07-23
  Administered 2011-07-22 – 2011-07-23 (×5): via INTRAVENOUS

## 2011-07-22 MED FILL — DIAZEPAM 5 MG TAB: 5 mg | ORAL | Qty: 2

## 2011-07-22 MED FILL — DOK 100 MG CAPSULE: 100 mg | ORAL | Qty: 1

## 2011-07-22 MED FILL — LYRICA 100 MG CAPSULE: 100 mg | ORAL | Qty: 1

## 2011-07-22 MED FILL — TOPIRAMATE 100 MG TAB: 100 mg | ORAL | Qty: 2

## 2011-07-22 MED FILL — IBUPROFEN 400 MG TAB: 400 mg | ORAL | Qty: 1

## 2011-07-22 MED FILL — ACETYLCYSTEINE 20 % (200 MG/ML) SOLN: 200 mg/mL (20 %) | Qty: 30

## 2011-07-22 MED FILL — MORPHINE 2 MG/ML INJECTION: 2 mg/mL | INTRAMUSCULAR | Qty: 1

## 2011-07-22 MED FILL — NICOTINE 7 MG/24 HR DAILY PATCH: 7 mg/24 hr | TRANSDERMAL | Qty: 1

## 2011-07-22 MED FILL — OXCARBAZEPINE 300 MG TAB: 300 mg | ORAL | Qty: 1

## 2011-07-22 MED FILL — LOVENOX 40 MG/0.4 ML SUBCUTANEOUS SYRINGE: 40 mg/0.4 mL | SUBCUTANEOUS | Qty: 0.4

## 2011-07-22 MED FILL — THERAPEUTIC MULTIVITAMIN ORAL LIQUID: ORAL | Qty: 5

## 2011-07-22 MED FILL — SALINE FLUSH INJECTION SYRINGE: INTRAMUSCULAR | Qty: 10

## 2011-07-22 MED FILL — MORPHINE ER 30 MG TAB: 30 mg | ORAL | Qty: 1

## 2011-07-22 MED FILL — CITALOPRAM 20 MG TAB: 20 mg | ORAL | Qty: 2

## 2011-07-22 MED FILL — ZOLPIDEM 5 MG TAB: 5 mg | ORAL | Qty: 2

## 2011-07-22 MED FILL — THIAMINE 100 MG/ML INJECTION: 100 mg/mL | INTRAMUSCULAR | Qty: 2
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
## 2011-07-22 NOTE — Progress Notes (Signed)
Bedside and Verbal shift change report given to yvonne slade rn (oncoming nurse) by jennifer taylor rn (offgoing nurse).  Report given with SBAR, Kardex, MAR and Recent Results.

## 2011-07-22 NOTE — Progress Notes (Signed)
Bedside and Verbal shift change report given to jennifer taylor rn (oncoming nurse) by yvonne slade rn (offgoing nurse).  Report given with SBAR, Kardex, MAR and Recent Results.

## 2011-07-22 NOTE — Progress Notes (Signed)
Pt called for pain med-pt very drowsy;falls asleep during conversation;upon return w/med;pt sleep-snoring w/head back;will give when pt awake

## 2011-07-22 NOTE — Progress Notes (Signed)
Physical Therapy  Referral received, chart reviewed and consulted with nursing. Prior to PT evaluation, PT witnessed pt ambulating independently x 3 episodes with steady gait. No deficits or safety concerns witnessed. PT spoke with pt who reports being at baseline for functional mobility and no concerns with ambulation or transfers. Pt with no skilled PT needs and PT signing off.   Thank you,  Carloyn Manner, PT, DPT

## 2011-07-23 LAB — IRON PROFILE
Iron % saturation: 32 % (ref 20–50)
Iron: 81 ug/dL (ref 35–150)
TIBC: 253 ug/dL (ref 250–450)

## 2011-07-23 LAB — TSH 3RD GENERATION: TSH: 2.48 u[IU]/mL (ref 0.36–3.74)

## 2011-07-23 LAB — FERRITIN: Ferritin: 194 NG/ML (ref 8–252)

## 2011-07-23 LAB — T3, FREE: Free Triiodothyronine (T3): 2.6 pg/mL (ref 2.2–4.0)

## 2011-07-23 LAB — T4, FREE: T4, Free: 1.1 NG/DL (ref 0.8–1.5)

## 2011-07-23 LAB — HEPATITIS PANEL, ACUTE
Hep B Core Ab, IgM: NEGATIVE
Hep B surface Ag screen: NEGATIVE
Hep C Virus Ab: <0.1 s/co ratio (ref 0.0–0.9)
Hepatitis A Ab, IgM: NEGATIVE

## 2011-07-23 LAB — TOPIRAMATE: Topiramate: 4.4 ug/mL (ref 2.0–25.0)

## 2011-07-23 MED ORDER — OXYCODONE-ACETAMINOPHEN 5 MG-325 MG TAB
5-325 mg | ORAL_TABLET | ORAL | Status: DC | PRN
Start: 2011-07-23 — End: 2012-09-16

## 2011-07-23 MED ORDER — IBUPROFEN 400 MG TAB
400 mg | ORAL_TABLET | Freq: Four times a day (QID) | ORAL | Status: DC | PRN
Start: 2011-07-23 — End: 2012-09-16

## 2011-07-23 MED ORDER — DIAZEPAM 5 MG TAB
5 mg | Freq: Three times a day (TID) | ORAL | Status: DC
Start: 2011-07-23 — End: 2011-07-23
  Administered 2011-07-23: 20:00:00 via ORAL

## 2011-07-23 MED ORDER — DOCUSATE SODIUM 100 MG CAP
100 mg | ORAL_CAPSULE | Freq: Two times a day (BID) | ORAL | Status: AC
Start: 2011-07-23 — End: 2011-10-21

## 2011-07-23 MED ORDER — MORPHINE ER 30 MG TAB
30 mg | Freq: Two times a day (BID) | ORAL | Status: DC
Start: 2011-07-23 — End: 2011-07-23

## 2011-07-23 MED ORDER — OXYCODONE-ACETAMINOPHEN 5 MG-325 MG TAB
5-325 mg | ORAL | Status: DC | PRN
Start: 2011-07-23 — End: 2011-07-23
  Administered 2011-07-23: 20:00:00 via ORAL

## 2011-07-23 MED FILL — OXCARBAZEPINE 300 MG TAB: 300 mg | ORAL | Qty: 1

## 2011-07-23 MED FILL — IBUPROFEN 400 MG TAB: 400 mg | ORAL | Qty: 1

## 2011-07-23 MED FILL — MORPHINE 2 MG/ML INJECTION: 2 mg/mL | INTRAMUSCULAR | Qty: 1

## 2011-07-23 MED FILL — DOK 100 MG CAPSULE: 100 mg | ORAL | Qty: 1

## 2011-07-23 MED FILL — OXYCODONE-ACETAMINOPHEN 5 MG-325 MG TAB: 5-325 mg | ORAL | Qty: 2

## 2011-07-23 MED FILL — MORPHINE ER 30 MG TAB: 30 mg | ORAL | Qty: 1

## 2011-07-23 MED FILL — FOLIC ACID 1 MG TAB: 1 mg | ORAL | Qty: 1

## 2011-07-23 MED FILL — DIAZEPAM 5 MG TAB: 5 mg | ORAL | Qty: 2

## 2011-07-23 MED FILL — CITALOPRAM 20 MG TAB: 20 mg | ORAL | Qty: 2

## 2011-07-23 MED FILL — ZOLPIDEM 5 MG TAB: 5 mg | ORAL | Qty: 2

## 2011-07-23 MED FILL — THIAMINE 100 MG/ML INJECTION: 100 mg/mL | INTRAMUSCULAR | Qty: 2

## 2011-07-23 MED FILL — LYRICA 100 MG CAPSULE: 100 mg | ORAL | Qty: 1

## 2011-07-23 MED FILL — THERAPEUTIC MULTIVITAMIN ORAL LIQUID: ORAL | Qty: 5

## 2011-07-23 MED FILL — TOPIRAMATE 100 MG TAB: 100 mg | ORAL | Qty: 2

## 2011-07-23 MED FILL — DIAZEPAM 5 MG TAB: 5 mg | ORAL | Qty: 1

## 2011-07-23 MED FILL — SALINE FLUSH INJECTION SYRINGE: INTRAMUSCULAR | Qty: 10

## 2011-07-23 NOTE — Progress Notes (Signed)
Patient's course of treatment, hospital stay and discharge planning were discussed by the interdisciplinary team which includes, nursing, nurse manager, nurse practitioner, care management, education, quality department representative, spiritual care, rehab therapy representative, rehab liason, and pharmacy. Plan discharge to [facility] either today or tomorrow.
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
## 2011-07-23 NOTE — Progress Notes (Signed)
Bedside and Verbal shift change report given to yvonne slade rn (oncoming nurse) by jennifer taylor rn (offgoing nurse).  Report given with SBAR, Kardex, MAR and Recent Results.
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
## 2011-07-23 NOTE — Progress Notes (Signed)
Patient discharge in stable condition ambulatory accompanied by [employer]. Discharge instruction & prescription given.

## 2011-07-23 NOTE — Consults (Signed)
Imp; bilat elbow injuries  Rec; no need for surg or strict immob, sling is fine , f/u 7-10 days

## 2011-07-24 LAB — TRICYCLIC ANTIDEPRESSANTS: Tricyclics: NEGATIVE ng/mL

## 2011-07-24 LAB — ALPHA-1-ANTITRYPSIN, TOTAL
AlpHa-1 Antitrypsin: 110 mg/dL (ref 90–200)
Alpha-1 Antitrypsin: 110 mg/dL (ref 90–200)

## 2011-07-24 LAB — ACTIN (SMOOTH MUSCLE) ANTIBODY
Actin (Smooth Muscle) Ab: 4 Units (ref 0–19)
Smooth Muscle Ab: 4 Units (ref 0–19)

## 2011-07-24 LAB — ANA BY MULTIPLEX FLOW IA, QL
ANA, Direct: NEGATIVE
ANA: NEGATIVE

## 2011-07-24 LAB — MITOCHONDRIAL M2 AB: Michochondrial (M2) Ab: 7.5 Units (ref 0.0–20.0)

## 2011-07-24 LAB — CERULOPLASMIN: Ceruloplasmin: 22.8 mg/dL (ref 16.0–45.0)

## 2011-07-27 MED ORDER — HYDROMORPHONE (PF) 1 MG/ML IJ SOLN
1 mg/mL | INTRAMUSCULAR | Status: AC
Start: 2011-07-27 — End: 2011-07-27
  Administered 2011-07-27: 18:00:00 via INTRAMUSCULAR

## 2011-07-27 MED ORDER — METHOCARBAMOL 750 MG TAB
750 mg | ORAL_TABLET | Freq: Three times a day (TID) | ORAL | Status: DC
Start: 2011-07-27 — End: 2012-09-16

## 2011-07-27 MED ORDER — DIAZEPAM 5 MG TAB
5 mg | Freq: Once | ORAL | Status: AC
Start: 2011-07-27 — End: 2011-07-27
  Administered 2011-07-27: 18:00:00 via ORAL

## 2011-07-27 MED FILL — HYDROMORPHONE (PF) 1 MG/ML IJ SOLN: 1 mg/mL | INTRAMUSCULAR | Qty: 1

## 2011-07-27 MED FILL — DIAZEPAM 5 MG TAB: 5 mg | ORAL | Qty: 1

## 2011-07-27 NOTE — ED Notes (Signed)
[name], PA and I have reviewed discharge instructions with the patient.  The patient verbalized understanding.
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
## 2011-07-30 NOTE — ED Provider Notes (Signed)
I was personally available for consultation in the emergency department.  I have reviewed the chart and agree with the documentation recorded by the MLP, including the assessment, treatment plan, and disposition.  MARY-THERESA F Dierre Crevier, MD

## 2012-06-08 MED ORDER — MORPHINE 4 MG/ML SYRINGE
4 mg/mL | INTRAMUSCULAR | Status: AC
Start: 2012-06-08 — End: 2012-06-08
  Administered 2012-06-08: 16:00:00 via INTRAMUSCULAR

## 2012-06-08 MED ORDER — NEOMYCIN-BACITRACIN-POLYMYXIN TOP. PACKET
CUTANEOUS | Status: AC
Start: 2012-06-08 — End: 2012-06-08
  Administered 2012-06-08: 15:00:00 via TOPICAL

## 2012-06-08 MED ORDER — ONDANSETRON 4 MG TAB, RAPID DISSOLVE
4 mg | ORAL_TABLET | Freq: Three times a day (TID) | ORAL | Status: DC | PRN
Start: 2012-06-08 — End: 2012-09-16

## 2012-06-08 MED ORDER — ONDANSETRON 4 MG TAB, RAPID DISSOLVE
4 mg | ORAL | Status: AC
Start: 2012-06-08 — End: 2012-06-08
  Administered 2012-06-08: 15:00:00 via ORAL

## 2012-06-08 MED FILL — MORPHINE 4 MG/ML SYRINGE: 4 mg/mL | INTRAMUSCULAR | Qty: 1

## 2012-06-08 MED FILL — TRIPLE ANTIBIOTIC (BACITRACIN BASE) TOPICAL PACKET: CUTANEOUS | Qty: 1

## 2012-06-08 MED FILL — ONDANSETRON 4 MG TAB, RAPID DISSOLVE: 4 mg | ORAL | Qty: 1

## 2012-06-08 NOTE — ED Notes (Signed)
Report and care transferred to [facility] and [place].

## 2012-06-08 NOTE — ED Provider Notes (Signed)
HPI Comments: 37 yo female h/o chronic pain syndrome/fibromyalgia c/o bilateral knee pain/wounds x 12 hours. Pt reports going up cement stairs when she tripped on her dog and fell up the stairs, landing on her knees. Pain is 10/10, R>L. Pain in right knee in "behind and in the joint". Pt states she tried taking her Morphine CR 30mg  and Morphine IR 15mg , which she has for chronic pain, this morning but began vomiting shortly after, she thinks d/t the pain. She has also tried ice, rest w/o relief.     Denies fever, weakness, tingling, paresthesias,      Patient is a 37 y.o. female presenting with knee pain. The history is provided by the patient.   Knee Pain   Associated symptoms include back pain.        Past Medical History   Diagnosis Date   ??? Seizures    ??? Psychiatric disorder      PTSD, anxiety,cutting   ??? Arthritis    ??? Other ill-defined conditions      fibromyalgia   ??? Other ill-defined conditions      chronic ear disease    ??? Other ill-defined conditions      toxic mildew syndrome   ??? Other ill-defined conditions      lyme disease   ??? Other ill-defined conditions      degenerative disc        Past Surgical History   Procedure Laterality Date   ??? Hx orthopaedic       left elbow ORIF   ??? Hx cholecystectomy     ??? Hx heent     ??? Hx gyn       BTL         Family History   Problem Relation Age of Onset   ??? Depression          History     Social History   ??? Marital Status: LEGALLY SEPARATED     Spouse Name: N/A     Number of Children: N/A   ??? Years of Education: N/A     Occupational History   ??? disabled Not Employed     Social History Main Topics   ??? Smoking status: Current Every Day Smoker -- 0.25 packs/day for 20 years     Types: Cigarettes   ??? Smokeless tobacco: Never Used      Comment: used to smoke 2ppd, now down to 1/2ppd( 06/2011)   ??? Alcohol Use: No      Comment: special occ.   ??? Drug Use: No   ??? Sexually Active: Not Currently -- Female partner(s)     Other Topics Concern   ??? Not on file     Social History  Narrative   ??? No narrative on file                  ALLERGIES: Aspirin; Benadryl; Ciprofloxacin; Darvocet a500; Ketorolac; Pcn; and Sulfa (sulfonamide antibiotics)      Review of Systems   Constitutional: Negative for fever, chills, appetite change and fatigue.   HENT: Negative for congestion, sore throat and sinus pressure.    Eyes: Negative for visual disturbance.   Respiratory: Negative for cough, shortness of breath and wheezing.    Cardiovascular: Negative for chest pain and palpitations.   Gastrointestinal: Negative for nausea, vomiting, abdominal pain and diarrhea.   Genitourinary: Negative for dysuria, frequency and hematuria.   Musculoskeletal: Positive for myalgias, back pain, joint swelling and arthralgias.   Skin:  Positive for wound. Negative for rash.   Neurological: Negative for dizziness, weakness, light-headedness and headaches.   All other systems reviewed and are negative.        Filed Vitals:    06/08/12 0933   BP: 113/85   Pulse: 58   Temp: 97.7 ??F (36.5 ??C)   Resp: 16   SpO2: 100%            Physical Exam   Nursing note and vitals reviewed.  Constitutional: She is oriented to person, place, and time. She appears well-developed and well-nourished. No distress.   HENT:   Head: Normocephalic and atraumatic.   Right Ear: External ear normal.   Eyes: Conjunctivae and EOM are normal. Pupils are equal, round, and reactive to light. Right eye exhibits no discharge. Left eye exhibits no discharge.   Neck: Normal range of motion. Neck supple.   Cardiovascular: Normal rate, regular rhythm and normal heart sounds.  Exam reveals no gallop and no friction rub.    No murmur heard.  Pulmonary/Chest: Effort normal and breath sounds normal. She has no wheezes.   Abdominal: Soft. Bowel sounds are normal. There is no tenderness. There is no rebound.   Musculoskeletal: Normal range of motion. She exhibits no edema and no tenderness.        Legs:  Abrasions, no ecchymosis, no lacerations. ROM limited d/t pain and  poor pt effort. Pt ambulatory. LE strength 4/5 L>R; full distal pulses   Lymphadenopathy:     She has no cervical adenopathy.   Neurological: She is alert and oriented to person, place, and time. No cranial nerve deficit.   Skin: Skin is warm and dry. There is erythema.   2 large abrasions on both knee caps.   Psychiatric: She has a normal mood and affect. Her behavior is normal. Judgment normal.        MDM     Differential Diagnosis; Clinical Impression; Plan:     DDX: fx v dislocation v abrasion v contusion v chronic pain v drug-seeking behavior  Amount and/or Complexity of Data Reviewed:   Clinical lab tests:  Ordered and reviewed  Tests in the radiology section of CPT??:  Ordered and reviewed   Review and summarize past medical records:  Yes   Discuss the patient with another provider:  Yes      Procedures    PROGRESS NOTE  Pt has a pain contract, but states she was directed to the ER after calling her doctor.      RESULTS    XR SPINE LUMB 2 OR 3 V (Final result)  Result time: 06/08/12 10:29:25      Final result by Rad Results In Edi (06/08/12 10:29:25)      Narrative:    **Final Report**      ICD Codes / Adm.Diagnosis: 140012 / Knee Pain   Examination: CR L SPINE 2 OR 3 VWS - [id number] - Jun 08 2012 10:01AM  Accession No: [id number]  Reason: fall      REPORT:  INDICATION: Fall    Comparison: July 21, 2011    FINDINGS: AP, lateral, and coned-down lateral views of the lumbar spine   demonstrate 5 lumbar type vertebral bodies. There is loss of the usual   lumbar spine lordosis. There is no vertebral subluxation. There is no   evidence of acute fracture. The sacroiliac joints appear intact. No soft   tissue abnormalities are seen.      IMPRESSION: No evidence of lumbar spine fracture  or subluxation.          Signing/Reading Doctor: Philomena Course [phone])   ApprovedPhilomena Course [phone]) Jun 08 2012 10:27AM                         XR KNEE RT 3 V (Final result)  Result time: 06/08/12 10:27:56      Final result by Rad  Results In Edi (06/08/12 10:27:56)      Narrative:    **Final Report**      ICD Codes / Adm.Diagnosis: 140012 / Knee Pain   Examination: CR KNEE 3 VWS RT - [id number] - Jun 08 2012 10:01AM  Accession No: [id number]  Reason: fall      REPORT:  EXAM: CR KNEE 3 VWS RT    INDICATION: fall    COMPARISON: August 09, 2010.    FINDINGS: Three views of the right knee demonstrate no fracture, effusion or   other osseous, articular or soft tissue abnormality.      IMPRESSION: Normal right knee.                Signing/Reading Doctor: Philomena Course [phone])   Approved: Philomena Course [phone]) Jun 08 2012 10:25AM     PLAN    -- Home with zofran so pt can take pain meds already available to her by prescription  -- F/U with PCP asap for pain mgmt  -- Return to ER prn    I have discussed with patient their results, diagnosis, treatment, and follow up plan. The patient agrees to follow up as outlined in discharge paperwork and also to return to the ED with any worsening. Ciro Backer, PA-C

## 2012-06-08 NOTE — ED Notes (Signed)
Report received by Donna, RN.

## 2012-06-08 NOTE — ED Notes (Signed)
Pt tripped over dog 12 hours ago.  Complains of pain in front and back of knee with abrasions to the skin.  Pt states she has nausea and vomiting secondary to pain.

## 2012-06-08 NOTE — ED Notes (Signed)
Patient given copy of dc instructions and 1 script(s).  Patient verbalized understanding of instructions and script (s).  Patient given a current medication reconciliation form and verbalized understanding of their medications.   Patient verbalized understanding of the importance of discussing medications with  his or her physician or clinic they will be following up with.  Patient alert and oriented and in no acute distress.  Patient discharged home ambulatory with husband.

## 2012-06-13 NOTE — ED Provider Notes (Signed)
I was personally available for consultation in the emergency department.  I have reviewed the chart and agree with the documentation recorded by the MLP, including the assessment, treatment plan, and disposition.  Akari Defelice S Tekisha Darcey, MD

## 2012-09-08 NOTE — ED Provider Notes (Signed)
I was personally available for consultation in the emergency department.  I have reviewed the chart and agree with the documentation recorded by the MLP, including the assessment, treatment plan, and disposition.  Tiago Humphrey S Elgar Scoggins, DO

## 2012-09-08 NOTE — ED Notes (Signed)
Pt ambulatory out of ED with discharge instructions and prescriptions in hand given by Dr. Terrence Dupont., NP; pt verbalized understanding of discharge paperwork and time allotted for questions. VSS. Pt alert and oriented. Pt accompanied by female friend.

## 2012-09-08 NOTE — ED Notes (Signed)
"  I just need my medication"

## 2012-09-08 NOTE — ED Notes (Signed)
Triage Note: "Last month my doctor referred to a different doctor for my pains and stuff."  Patient continues to state, "get my husband to tell you why I'm here."  Per husband, patient was referred to another "specialist" doctor because her regular pain doctor is "at their maximum with my pain medications" and that specialist can't see her for a month so the pain management doctor wrote a letter and told her to go to her PCP and get her medications filled and her PCP can't see her for 2 weeks so they told her to come to the ER for pain medications.  Pain management doctor letter states she takes: Morphine 30mg  ER BID and Morphine 15mg  TID.

## 2012-09-08 NOTE — ED Provider Notes (Signed)
Patient is a 37 y.o. female presenting with medication refill.   Medication Refill  Pertinent negatives include no abdominal pain, no headaches and no shortness of breath.    Pt. Presents to the ED stating (crying)  that her pain management doctor will no longer treat her pain and that she must seek another doctor for pain control. She states that her PCP can not see her and was referred to the ED. She states that she suffers from chronic back pain and is out of her meds. Denies any blunt trauma or new injury. Denies fever, abdominal pain or urinary symptoms.Denies any saddle anesthesia or changes in bowel habits. Pain increases with bending, lifting and position changes. Gait is slow but steady; reflexes are normal.      Past Medical History   Diagnosis Date   ??? Seizures    ??? Psychiatric disorder      PTSD, anxiety,cutting   ??? Arthritis    ??? Other ill-defined conditions      fibromyalgia   ??? Other ill-defined conditions      chronic ear disease    ??? Other ill-defined conditions      toxic mildew syndrome   ??? Other ill-defined conditions      lyme disease   ??? Other ill-defined conditions      degenerative disc        Past Surgical History   Procedure Laterality Date   ??? Hx orthopaedic       left elbow ORIF   ??? Hx cholecystectomy     ??? Hx heent     ??? Hx gyn       BTL         Family History   Problem Relation Age of Onset   ??? Depression          History     Social History   ??? Marital Status: LEGALLY SEPARATED     Spouse Name: N/A     Number of Children: N/A   ??? Years of Education: N/A     Occupational History   ??? disabled Not Employed     Social History Main Topics   ??? Smoking status: Current Every Day Smoker -- 0.25 packs/day for 20 years     Types: Cigarettes   ??? Smokeless tobacco: Never Used      Comment: used to smoke 2ppd, now down to 1/2ppd( 06/2011)   ??? Alcohol Use: No      Comment: special occ.   ??? Drug Use: No   ??? Sexually Active: Not Currently -- Female partner(s)     Other Topics Concern   ??? Not on file      Social History Narrative   ??? No narrative on file                  ALLERGIES: Aspirin; Benadryl; Ciprofloxacin; Darvocet a500; Ketorolac; Pcn; and Sulfa (sulfonamide antibiotics)      Review of Systems   Constitutional: Negative for activity change and appetite change.   HENT: Negative for sore throat, facial swelling, trouble swallowing and neck pain.    Eyes: Negative.    Respiratory: Negative for shortness of breath.    Cardiovascular: Negative.    Gastrointestinal: Negative for vomiting, abdominal pain and diarrhea.   Genitourinary: Negative for dysuria.   Musculoskeletal: Positive for back pain.   Skin: Negative for color change.   Neurological: Negative for headaches.   Psychiatric/Behavioral: Negative.        Filed  Vitals:    09/08/12 1542   BP: 113/71   Pulse: 82   Temp: 98.3 ??F (36.8 ??C)   Resp: 18   Height: 5' 3.5" (1.613 m)   Weight: 77.111 kg (170 lb)   SpO2: 98%            Physical Exam   Nursing note and vitals reviewed.  Constitutional: She is oriented to person, place, and time. She appears well-nourished.   White female; smoker   HENT:   Head: Normocephalic.   Eyes: Pupils are equal, round, and reactive to light.   Neck: Normal range of motion. Neck supple.   Cardiovascular: Normal rate and regular rhythm.    Pulmonary/Chest: Effort normal and breath sounds normal.   Abdominal: Bowel sounds are normal.   Neurological: She is alert and oriented to person, place, and time.   Skin: Skin is warm and dry.        MDM    Procedures    Discussed with the pt the need for her to have her pain managed by a pain management specialist and not the ED. Reviewed what prescriptions that she recently filled per the [facility] prescription drug monitoring program. 4:16 PM  Patient was given referrals for PCP and pain management.  Patient and/or family have verbally conveyed their understanding and agreement of the patient's signs, symptoms, diagnosis, treatment and prognosis and additionally agree to follow up as recommended  or return to the Emergency Room should her condition change prior to follow-up.  Discharge instructions have also been provided to the patient with some educational information regarding her diagnosis as well a list of reasons why she would want to return to the ER prior to her follow-up appointment should her condition change.  Discussed plan of care with Dr. Rennis Chris.Yetta Numbers, NP

## 2012-09-16 MED ORDER — MORPHINE 2 MG/ML INJECTION
2 mg/mL | INTRAMUSCULAR | Status: AC
Start: 2012-09-16 — End: 2012-09-16
  Administered 2012-09-17: via INTRAMUSCULAR

## 2012-09-16 NOTE — ED Provider Notes (Signed)
Patient is a 37 y.o. female presenting with abdominal pain. The history is provided by the patient.   Abdominal Pain   This is a new problem. The current episode started yesterday. The problem occurs constantly. The pain is associated with vomiting. The pain is located in the LLQ. Associated symptoms include nausea and vomiting. Pertinent negatives include no melena and no dysuria. Nothing worsens the pain. The pain is relieved by nothing.        Past Medical History   Diagnosis Date   ??? Seizures    ??? Psychiatric disorder      PTSD, anxiety,cutting   ??? Arthritis    ??? Other ill-defined conditions      fibromyalgia   ??? Other ill-defined conditions      chronic ear disease    ??? Other ill-defined conditions      toxic mildew syndrome   ??? Other ill-defined conditions      lyme disease   ??? Other ill-defined conditions      degenerative disc        Past Surgical History   Procedure Laterality Date   ??? Hx orthopaedic       left elbow ORIF   ??? Hx cholecystectomy     ??? Hx heent     ??? Hx gyn       BTL         Family History   Problem Relation Age of Onset   ??? Depression          History     Social History   ??? Marital Status: LEGALLY SEPARATED     Spouse Name: N/A     Number of Children: N/A   ??? Years of Education: N/A     Occupational History   ??? disabled Not Employed     Social History Main Topics   ??? Smoking status: Current Every Day Smoker -- 0.25 packs/day for 20 years     Types: Cigarettes   ??? Smokeless tobacco: Never Used      Comment: used to smoke 2ppd, now down to 1/2ppd( 06/2011)   ??? Alcohol Use: No      Comment: special occ.   ??? Drug Use: No   ??? Sexually Active: Not Currently -- Female partner(s)     Other Topics Concern   ??? Not on file     Social History Narrative   ??? No narrative on file                  ALLERGIES: Aspirin; Benadryl; Ciprofloxacin; Darvocet a500; Ketorolac; Pcn; and Sulfa (sulfonamide antibiotics)      Review of Systems   Gastrointestinal: Positive for nausea, vomiting and abdominal pain. Negative  for melena.   Genitourinary: Negative for dysuria.   All other systems reviewed and are negative.        Filed Vitals:    09/16/12 1932   BP: 108/82   Pulse: 70   Temp: 98.3 ??F (36.8 ??C)   Resp: 16   Height: 5\' 4"  (1.626 m)   Weight: 71.668 kg (158 lb)   SpO2: 97%            Physical Exam   Nursing note and vitals reviewed.  Constitutional: She is oriented to person, place, and time. She appears well-developed and well-nourished.   HENT:   Head: Normocephalic and atraumatic.   Mouth/Throat: Oropharynx is clear and moist. No oropharyngeal exudate.   Eyes: Conjunctivae and EOM are normal. Pupils are  equal, round, and reactive to light. Right eye exhibits no discharge. Left eye exhibits no discharge. No scleral icterus.   Neck: Normal range of motion. No tracheal deviation present.   Cardiovascular: Normal rate, regular rhythm and normal heart sounds.    No murmur heard.  Pulmonary/Chest: Effort normal and breath sounds normal. No respiratory distress. She has no wheezes. She has no rales. She exhibits no tenderness.   Abdominal: Soft. Bowel sounds are normal. She exhibits no distension. There is tenderness. There is no rebound and no guarding.   Sl. llq tenderness   Musculoskeletal: Normal range of motion. She exhibits no edema and no tenderness.   Lymphadenopathy:     She has no cervical adenopathy.   Neurological: She is alert and oriented to person, place, and time.   Skin: Skin is warm. No erythema.        MDM    Procedures

## 2012-09-16 NOTE — ED Notes (Signed)
Patient (s) was given copy of dc instructions and 2 script(s).  Patient (s)  verbalized understanding of instructions and script (s).  Patient given a current medication reconciliation form and verbalized understanding of their medications.   Patient (s) verbalized understanding of the importance of discussing medications with  his or her physician or clinic they will be following up with.  Patient alert and oriented and in no acute distress.  Patient discharged home ambulatory with boyfriend.

## 2012-09-16 NOTE — ED Notes (Signed)
Per pt reports "bad pain in belly for two days, nausea/vomiting, denies diarrhea, gallbladder removed so I'm not sure what it is, took ibuprofen for the pain, nothing makes it better, change in position makes it worst, constant pain that is sharp, denies back pain, denies urinary frequency and burning with urination, frequent fevers."

## 2012-09-16 NOTE — ED Notes (Signed)
Pt. States she doesn't have bad reaction to keflex

## 2012-09-16 NOTE — ED Notes (Signed)
Patient given orange and frozen dinner meal.

## 2012-09-17 LAB — POC CHEM8
Anion gap (POC): 14 mmol/L (ref 5–15)
BUN (POC): 19 MG/DL (ref 9–20)
CO2 (POC): 24 MMOL/L (ref 21–32)
Calcium, ionized (POC): 1.25 MMOL/L (ref 1.12–1.32)
Chloride (POC): 107 MMOL/L (ref 98–107)
Creatinine (POC): 1.2 MG/DL (ref 0.6–1.3)
GFRAA, POC: >60 mL/min/{1.73_m2} (ref >60)
GFRNA, POC: 50 mL/min/{1.73_m2} — ABNORMAL LOW (ref >60)
Glucose (POC): 86 MG/DL (ref 65–105)
Hematocrit (POC): 46 % (ref 35.0–47.0)
Hemoglobin (POC): 15.6 GM/DL (ref 11.5–16.0)
Potassium (POC): 3.9 MMOL/L (ref 3.5–5.1)
Sodium (POC): 140 MMOL/L (ref 136–145)

## 2012-09-17 LAB — CBC WITH AUTOMATED DIFF
ABS. BASOPHILS: 0.1 10*3/uL (ref 0.0–0.1)
ABS. EOSINOPHILS: 0.9 10*3/uL — ABNORMAL HIGH (ref 0.0–0.4)
ABS. LYMPHOCYTES: 4.6 10*3/uL — ABNORMAL HIGH (ref 0.8–3.5)
ABS. MONOCYTES: 0.8 10*3/uL (ref 0.0–1.0)
ABS. NEUTROPHILS: 4.7 10*3/uL (ref 1.8–8.0)
BASOPHILS: 1 % (ref 0–1)
EOSINOPHILS: 8 % — ABNORMAL HIGH (ref 0–7)
HCT: 44.8 % (ref 35.0–47.0)
HGB: 15.8 g/dL (ref 11.5–16.0)
LYMPHOCYTES: 41 % (ref 12–49)
MCH: 31.4 PG (ref 26.0–34.0)
MCHC: 35.3 g/dL (ref 30.0–36.5)
MCV: 89.1 FL (ref 80.0–99.0)
MONOCYTES: 7 % (ref 5–13)
NEUTROPHILS: 43 % (ref 32–75)
PLATELET: 241 10*3/uL (ref 150–400)
RBC: 5.03 M/uL (ref 3.80–5.20)
RDW: 14.2 % (ref 11.5–14.5)
WBC: 11 10*3/uL (ref 3.6–11.0)

## 2012-09-17 LAB — URINALYSIS W/ REFLEX CULTURE
Bilirubin: NEGATIVE
Blood: NEGATIVE
Glucose: NEGATIVE mg/dL
Ketone: NEGATIVE mg/dL
Nitrites: NEGATIVE
Protein: NEGATIVE mg/dL
Specific gravity: 1.02 (ref 1.003–1.030)
Urobilinogen: 0.2 EU/dL (ref 0.2–1.0)
pH (UA): 6.5 (ref 5.0–8.0)

## 2012-09-17 LAB — DRUG SCREEN, URINE
AMPHETAMINES: NEGATIVE
BARBITURATES: NEGATIVE
BENZODIAZEPINES: POSITIVE — AB
COCAINE: NEGATIVE
METHADONE: NEGATIVE
OPIATES: POSITIVE — AB
PCP(PHENCYCLIDINE): NEGATIVE
THC (TH-CANNABINOL): POSITIVE — AB

## 2012-09-17 LAB — HCG URINE, QL. - POC: Pregnancy test,urine (POC): NEGATIVE

## 2012-09-17 MED ORDER — OXYCODONE CR 10 MG 12 HR TAB
10 mg | ORAL_TABLET | Freq: Two times a day (BID) | ORAL | Status: AC
Start: 2012-09-17 — End: 2012-09-18

## 2012-09-17 MED ORDER — CEPHALEXIN 500 MG CAP
500 mg | ORAL_CAPSULE | Freq: Four times a day (QID) | ORAL | Status: AC
Start: 2012-09-17 — End: 2012-09-23

## 2012-09-18 LAB — CULTURE, URINE
Colonies Counted: <10000
Colony Count: <10000
Culture result:: NO GROWTH
Culture: NO GROWTH

## 2012-11-22 ENCOUNTER — Encounter

## 2012-12-04 ENCOUNTER — Encounter

## 2012-12-27 NOTE — ED Notes (Signed)
Pt presents to ED complaining of chest pain. Pt states the pain in her chest is the initial reason she came to ED. Pt states she has not had a seizure in 7 months, reports she sees a neurologist who recently took her off the Depakote she was taking for seizures. Pt is alert and oriented x 4, RR even and unlabored, skin is warm and dry. Pt's husband is at the bedside with her. Pt placed on monitor x 3. Assesment completed and pt updated on plan of care.

## 2012-12-27 NOTE — ED Notes (Signed)
Discharge instructions were given to the patient by RN. The patient left the Emergency Department ambulatory with 0 prescriptions to go home.    The patient was encouraged to call or return to the ED for further issues or problems.    The patient voiced understanding of discharge instructions, all questions were answered. .

## 2012-12-27 NOTE — ED Notes (Signed)
Pt found in ED driveway by triage nurse on the ground. Triage nurse reporting seizure-like activity. Pt alert and oriented x 4 although very tearful, was able to get onto her feet and placed on stretcher and roomed immediately. Pt's husband here with her.

## 2012-12-27 NOTE — ED Provider Notes (Signed)
Patient is a 37 y.o. female presenting with chest pain and seizures. The history is provided by the patient. No language interpreter was used.   Chest Pain   This is a new problem. The current episode started 3 to 5 hours ago. The problem has not changed since onset.The problem occurs constantly. The pain is present in the right side. The pain is at a severity of 5/10. The pain is moderate. The quality of the pain is described as sharp. The pain does not radiate. Pertinent negatives include no abdominal pain, no back pain, no fever, no headaches, no nausea, no shortness of breath, no vomiting and no weakness.   Seizure   Associated symptoms include chest pain. Pertinent negatives include no headaches, no visual disturbance, no sore throat, no nausea and no vomiting.   She reports chest pain.  She reports no visual disturbance, no vomiting, no headaches and no sore throat.        Past Medical History   Diagnosis Date   ??? Seizures    ??? Psychiatric disorder      PTSD, anxiety,cutting   ??? Arthritis    ??? Other ill-defined conditions      fibromyalgia   ??? Other ill-defined conditions      chronic ear disease    ??? Other ill-defined conditions      toxic mildew syndrome   ??? Other ill-defined conditions      lyme disease   ??? Other ill-defined conditions      degenerative disc        Past Surgical History   Procedure Laterality Date   ??? Hx orthopaedic       left elbow ORIF   ??? Hx cholecystectomy     ??? Hx heent     ??? Hx gyn       BTL         Family History   Problem Relation Age of Onset   ??? Depression          History     Social History   ??? Marital Status: DIVORCED     Spouse Name: N/A     Number of Children: N/A   ??? Years of Education: N/A     Occupational History   ??? disabled Not Employed     Social History Main Topics   ??? Smoking status: Current Every Day Smoker -- 0.25 packs/day for 20 years     Types: Cigarettes   ??? Smokeless tobacco: Never Used      Comment: used to smoke 2ppd, now down to 1/2ppd( 06/2011)   ??? Alcohol  Use: No      Comment: special occ.   ??? Drug Use: No   ??? Sexually Active: Not Currently -- Female partner(s)     Other Topics Concern   ??? Not on file     Social History Narrative   ??? No narrative on file                  ALLERGIES: Aspirin; Benadryl; Ciprofloxacin; Darvocet a500; Ketorolac; Pcn; and Sulfa (sulfonamide antibiotics)      Review of Systems   Constitutional: Negative for fever.   HENT: Negative for sore throat and neck pain.    Eyes: Negative for visual disturbance.   Respiratory: Negative for shortness of breath and wheezing.    Cardiovascular: Positive for chest pain.   Gastrointestinal: Negative for nausea, vomiting and abdominal pain.   Endocrine: Negative for polyuria.   Genitourinary:  Negative for dysuria.   Musculoskeletal: Negative for back pain.   Skin: Negative for rash.   Allergic/Immunologic: Negative for immunocompromised state.   Neurological: Negative for weakness and headaches.   Psychiatric/Behavioral: Negative.  Negative for agitation.       Filed Vitals:    12/27/12 2028   BP: 105/59   Pulse: 72   Temp: 97.7 ??F (36.5 ??C)   Resp: 20   Height: 5\' 5"  (1.651 m)   Weight: 72.576 kg (160 lb)   SpO2: 99%            Physical Exam   Nursing note and vitals reviewed.  Constitutional: She is oriented to person, place, and time. She appears well-nourished.   Eyes: EOM are normal. Right eye exhibits no discharge. Left eye exhibits no discharge.   Neck: Neck supple. No thyromegaly present.   Cardiovascular: Normal rate and regular rhythm.  Exam reveals no friction rub.    No murmur heard.  Pulmonary/Chest: No respiratory distress. She has no wheezes.   Abdominal: Bowel sounds are normal.   Musculoskeletal: She exhibits no tenderness.   Neurological: She is oriented to person, place, and time.   Skin: Skin is warm and dry.        MDM    Procedures

## 2012-12-27 NOTE — ED Notes (Signed)
Seizure precautions initiated, side rails padded x 2.

## 2012-12-27 NOTE — ED Notes (Signed)
Pt uncooperative at this time. Refusing to answer any questions. Pt c/o SOB and chest pain. When asked yes or no questions pt yelling and states "you keep asking me all these questions. I want my fiance in here.". Pt continues rambling throughout assessment and refusing to answer any questions. After walking out of room pt noted to be walking about department. Fiance assisted pt back to room and now to bedside.

## 2012-12-28 LAB — CBC WITH AUTOMATED DIFF
ABS. BASOPHILS: 0 10*3/uL (ref 0.0–0.1)
ABS. EOSINOPHILS: 0.7 10*3/uL — ABNORMAL HIGH (ref 0.0–0.4)
ABS. LYMPHOCYTES: 6.3 10*3/uL — ABNORMAL HIGH (ref 0.8–3.5)
ABS. MONOCYTES: 0.6 10*3/uL (ref 0.0–1.0)
ABS. NEUTROPHILS: 2.5 10*3/uL (ref 1.8–8.0)
BAND NEUTROPHILS: 5 % (ref 0–6)
BASOPHILS: 0 % (ref 0–1)
EOSINOPHILS: 7 % (ref 0–7)
HCT: 44.2 % (ref 35.0–47.0)
HGB: 15.4 g/dL (ref 11.5–16.0)
LYMPHOCYTES: 62 % — ABNORMAL HIGH (ref 12–49)
MCH: 31.3 PG (ref 26.0–34.0)
MCHC: 34.8 g/dL (ref 30.0–36.5)
MCV: 89.8 FL (ref 80.0–99.0)
MONOCYTES: 6 % (ref 5–13)
NEUTROPHILS: 20 % — ABNORMAL LOW (ref 32–75)
PLATELET: 220 10*3/uL (ref 150–400)
RBC: 4.92 M/uL (ref 3.80–5.20)
RDW: 12.8 % (ref 11.5–14.5)
WBC COMMENTS: REACTIVE
WBC: 10.1 10*3/uL (ref 3.6–11.0)

## 2012-12-28 LAB — METABOLIC PANEL, BASIC
Anion gap: 10 mmol/L (ref 5–15)
BUN/Creatinine ratio: 15 (ref 12–20)
BUN: 13 MG/DL (ref 6–20)
CO2: 28 mmol/L (ref 21–32)
Calcium: 9 MG/DL (ref 8.5–10.1)
Chloride: 101 mmol/L (ref 97–108)
Creatinine: 0.86 MG/DL (ref 0.45–1.15)
GFR est AA: >60 mL/min/{1.73_m2} (ref >60)
GFR est non-AA: >60 mL/min/{1.73_m2} (ref >60)
Glucose: 109 mg/dL — ABNORMAL HIGH (ref 65–100)
Potassium: 3.5 mmol/L (ref 3.5–5.1)
Sodium: 139 mmol/L (ref 136–145)

## 2012-12-28 LAB — D DIMER: D-dimer: 0.39 mg/L FEU (ref 0.00–0.65)

## 2012-12-28 LAB — D-DIMER, QUANTITATIVE: D-Dimer, Quant: 0.39 mg/L FEU (ref 0.00–0.65)

## 2012-12-29 LAB — EKG, 12 LEAD, INITIAL
Atrial Rate: 68 {beats}/min
Calculated P Axis: 37 degrees
Calculated R Axis: 29 degrees
Diagnosis: NORMAL
P-R Interval: 158 ms
Q-T Interval: 450 ms
QRS Duration: 76 ms
QTC Calculation (Bezet): 478 ms
Ventricular Rate: 68 {beats}/min

## 2013-08-21 MED ORDER — ESTRADIOL 0.05 MG/24 HR WEEKLY TRANSDERM PATCH
0.05 mg/24 hr | MEDICATED_PATCH | TRANSDERMAL | Status: DC
Start: 2013-08-21 — End: 2013-10-16

## 2013-08-21 MED ORDER — PROGESTERONE MICRONIZED 100 MG CAP
100 mg | ORAL_CAPSULE | Freq: Every day | ORAL | Status: DC
Start: 2013-08-21 — End: 2013-08-21

## 2013-08-21 MED ORDER — PROGESTERONE MICRONIZED 100 MG CAP
100 mg | ORAL_CAPSULE | ORAL | Status: DC
Start: 2013-08-21 — End: 2014-08-03

## 2013-08-21 NOTE — Patient Instructions (Addendum)
Hormone Therapy (HT): After Your Visit  Your Care Instructions  Hormone therapy (HT) is medicine to treat symptoms of menopause, such as hot flashes, vaginal dryness, and sleep problems. It replaces the hormones that drop at menopause. Most women get relief from these symptoms within weeks of starting HT.  HT contains two female hormones, estrogen and progestin. HT may come in the form of a pill, patch, gel, spray, or vaginal ring. A vaginal cream or a vaginal ring that has a much lower dose of estrogen may be used to relieve vaginal dryness only.  HT has some risks. Most doctors recommend that women only take HT for as short a time as possible. This is to reduce the chances of heart disease, breast cancer, blood clots, and stroke that may be connected to HT. Be sure to have regular checkups with your doctor when taking HT.  Talk with your doctor about whether HT is right for you. If you decide that the benefits of HT outweigh the risks, ask your doctor to prescribe the lowest effective dose for as short a time as possible.  Follow-up care is a key part of your treatment and safety. Be sure to make and go to all appointments, and call your doctor if you are having problems. It's also a good idea to know your test results and keep a list of the medicines you take.  Why might you take HT?  ?? HT reduces symptoms of menopause. These include hot flashes, mood swings, and sleep problems.  ?? The estrogen in HT helps to prevent thinning bones. And it may lower the chance of colon cancer.  ?? HT helps keep the lining of the vagina moist and thick. This can reduce irritation.  ?? HT helps protect against dental problems, such as tooth loss and gum disease.  What are the risks of taking HT?  ?? Some women who take HT may have vaginal bleeding, bloating, nausea, sore breasts, mood swings, and headaches. Talk to your doctor about changing the type of HT you take or lowering the dose. This may help to end these side effects.   ?? Taking HT may slightly increase your risk for heart disease, breast cancer, ovarian cancer, blood clots, and stroke.  ?? You should not take HT if you:  ?? Could be pregnant.  ?? Have a personal history of breast cancer, endometrial cancer, pulmonary embolism, deep vein thrombosis, heart attack, or stroke.  ?? Have vaginal bleeding from an unknown cause.  ?? Have active liver disease.  What can you do to reduce the symptoms of menopause?  ?? Eat healthy foods and get regular exercise. This also will help to maintain strong bones and a healthy heart.  ?? Do not smoke. If you smoke, you can reduce hot flashes and long-term health risks by stopping. If you need help quitting, talk to your doctor about stop-smoking programs and medicines. These can increase your chances of quitting for good.  ?? Practice daily breathing exercises (meditation) to reduce hot flashes and mood swings.  ?? Limit the amount of alcohol you drink. This can reduce symptoms of menopause and long-term health risks.  ?? Keep your home and office cool.  ?? Use a vaginal lubricant, such as Astroglide, Wet Gel Lubricant, or K-Y Jelly.  ?? Do pelvic floor (Kegel) exercises, which tighten and strengthen pelvic muscles. To do Kegel exercises:  ?? Squeeze the same muscles you would use to stop your urine. Your belly and thighs should not move.  ??   Hold the squeeze for 3 seconds, then relax for 3 seconds.  ?? Start with 3 seconds. Then add 1 second each week until you are able to squeeze for 10 seconds.  ?? Repeat the exercise 10 to 15 times a session. Do three or more sessions a day.   Where can you learn more?   Go to [email or web address]  Enter V552 in the search box to learn more about "Hormone Therapy (HT): After Your Visit."   ?? 2006-2015 [employer]. Care instructions adapted under license by [employer] (which disclaims liability or warranty for this information). This care instruction is for use with your licensed  healthcare professional. If you have questions about a medical condition or this instruction, always ask your healthcare professional. [employer] disclaims any warranty or liability for your use of this information.  Content Version: 10.5.422740; Current as of: March 03, 2013            Estradiol Patch (Absorbed through the skin)   Estradiol (es-tra-DYE-ol)  Treats hot flashes and vaginal problems during menopause. Also treats low estrogen levels and prevent osteoporosis after menopause.   Brand Name(s):Alora, Climara, Menostar, Minivelle, Vivelle, Vivelle-Dot   There may be other brand names for this medicine.  When This Medicine Should Not Be Used:   This medicine is not right for everyone. Do not use if you had an allergic reaction to estradiol, are pregnant, or have a blood clotting disorder, liver disease, or a history of heart attack or stroke. Tell your doctor if you have a history of cancer.  How to Use This Medicine:   Patch  ?? Your doctor will tell you how many patches to use, where to apply them, and how often to apply them. Do not use more patches or apply them more often than your doctor tells you to.  ?? Read and follow the patient instructions that come with this medicine. Talk to your doctor or pharmacist if you have any questions.  ?? Wash your hands with soap and water before and after applying a patch.  ?? Leave the patch in its sealed wrapper until you are ready to put it on. Tear the wrapper open carefully. NEVER CUT the wrapper or the patch with scissors. Do not use any patch that has been cut by accident.  ?? The patient instructions will show the body areas where you can wear the patch. When putting on each new patch, choose a different place within these areas. Do not put the new patch on the same place you wore the last one. Be sure to remove the old patch before applying a new one.  ?? Place the patch on a clean, dry area of your lower stomach or upper  buttock area, where there is no oil, lotion, or powder. Do not apply the patch on or near your breasts, over cut or broken skin, or in a spot where it might rub off (such as at the waistline).  ?? Press the patch firmly in place with your hand for about 10 seconds.  ?? Change your patch on the same days of each week, to help you remember.  ?? If you have any adhesive left on your skin after you remove the patch, allow it to dry for 15 minutes. Then gently rub the sticky area with oil or lotion to remove the adhesive.  ?? You may take a bath, shower, or swim while wearing a patch.  ?? Fold the used patch in half with the  sticky side together. Place it in a sturdy childproof container and throw away, out of the reach of children and pets. Do not flush the patch down the toilet.  ?? Missed dose: If you forget to wear or change a patch, put one on as soon as you can. If it is almost time to put on your next patch, wait until then to apply a new patch and skip the one you missed. Do not apply extra patches to make up for a missed dose.  ?? If a patch falls off, just put it back on a different area. If the patch does not stick completely, put on a new patch, but continue to follow your original schedule for changing to a new one.  ?? Store the patches at room temperature in a closed container, away from heat, moisture, and direct light. Do not open the pouch until you are ready to use the patch.  Drugs and Foods to Avoid:   Ask your doctor or pharmacist before using any other medicine, including over-the-counter medicines, vitamins, and herbal products.  ?? Some foods and medicines can affect how estradiol works. Tell your doctor if you are using St John's wort, carbamazepine, clarithromycin, erythromycin, itraconazole, ketoconazole, phenobarbital, rifampin, ritonavir, thyroid medicine, or a blood thinner (such as warfarin).  ?? Do not eat grapefruit or drink grapefruit juice while you are using this medicine.   Warnings While Using This Medicine:   ?? Pregnancy after menopause is not likely, but if you think you could be pregnant, tell your doctor. This medicine could harm an unborn baby.  ?? Tell your doctor if you are breastfeeding, or if you have kidney disease, liver disease, asthma, diabetes, endometriosis, epilepsy, migraine headaches, porphyria, lupus, thyroid problems, heart disease, high blood pressure, high cholesterol or triglycerides, or a history of cancer. Tell your doctor if you had liver problems caused by pregnancy or estrogen.  ?? This medicine may cause the following problems:  ?? Higher risk of heart attack, stroke, or blood clots  ?? Higher risk of endometrial cancer, breast cancer, or uterine cancer  ?? Higher risk of dementia, especially in women 52 years of age and older  ?? Gallbladder disease  ?? Vision problems  ?? Tell any doctor or dentist who treats you that you are using this medicine. This medicine may affect certain medical test results. You may need to stop using this medicine before and after you have surgery or if you need to stay in bed for a long time.  ?? Your doctor will check your progress and the effects of this medicine at regular visits. Keep all appointments. Have regular exams and mammograms as directed by your doctor.  ?? Keep all medicine out of the reach of children. Never share your medicine with anyone.  Possible Side Effects While Using This Medicine:   Call your doctor right away if you notice any of these side effects:  ?? Allergic reaction: Itching or hives, swelling in your face or hands, swelling or tingling in your mouth or throat, chest tightness, trouble breathing  ?? Breast lump  ?? Chest pain, trouble breathing, or coughing up blood  ?? Chest pain that may spread, trouble breathing, nausea, unusual sweating, fainting  ?? Numbness or weakness on one side of your body, sudden or severe headache, problems with vision, speech, or walking  ?? Swelling in your hands, ankles, or feet   ?? Unusual vaginal bleeding or heavy bleeding  ?? Vision changes  If you notice  these less serious side effects, talk with your doctor:   ?? Headache  ?? Mild nausea, vomiting, gas  ?? Runny or stuffy nose  ?? Skin redness or itching where the patch is placed  ?? Swollen or tender breasts  If you notice other side effects that you think are caused by this medicine, tell your doctor.   Call your doctor for medical advice about side effects. You may report side effects to FDA at 1-800-FDA-1088  ?? 2014 [facility] Information is for End User's use only and may not be sold, redistributed or otherwise used for commercial purposes.  The above information is an educational aid only. It is not intended as medical advice for individual conditions or treatments. Talk to your doctor, nurse or pharmacist before following any medical regimen to see if it is safe and effective for you.  Progesterone (By mouth)   Progesterone (proe-JES-ter-one)  Helps prevent changes in the uterus in women who are taking estrogen after menopause. Also treats menstrual periods that have stopped before menopause.   Brand Name(s):Prometrium   There may be other brand names for this medicine.  When This Medicine Should Not Be Used:   This medicine is not right for everyone. Do not use it if you had an allergic reaction to progesterone or peanuts, or if you have liver disease or a history of breast cancer or blood clots (including heart attack or stroke). Do not use this medicine if you are pregnant.  How to Use This Medicine:   Capsule  ?? Take your medicine as directed.  ?? This medicine is usually taken every day for 10 to 12 days, depending on the reason you are using it. If you also take estrogen, carefully follow the schedule for both medicines together.  ?? If you have trouble swallowing this medicine, take it with a glass of water while standing up. Talk to your doctor or pharmacist if this does not help.   ?? Take the medicine at bedtime unless your doctor tells you otherwise.  ?? Read and follow the patient instructions that come with this medicine. Talk to your doctor or pharmacist if you have any questions.  ?? Missed dose: Take a dose as soon as you remember. If it is almost time for your next dose, wait until then and take a regular dose. Do not take extra medicine to make up for a missed dose.  ?? Store the medicine in a closed container at room temperature, away from heat, moisture, and direct light.  Drugs and Foods to Avoid:     Ask your doctor or pharmacist before using any other medicine, including over-the-counter medicines, vitamins, and herbal products.     Warnings While Using This Medicine:   ?? Tell your doctor if you are pregnant or breastfeeding, or if you have kidney disease, asthma, diabetes, endometriosis, epilepsy, migraine headaches, lupus, thyroid problems, or a history of depression. Tell your doctor about any problems with your heart or blood, such as heart disease, blood clotting problems, high blood pressure, or high cholesterol. Tell your doctor if you smoke.  ?? This medicine may cause the following problems:  ?? Blood clots, which could lead to stroke, heart attack, or other serious problems  ?? Dementia (when used together with estrogen in women older than 65)  ?? Increased risk of breast or endometrial cancer (when used together with estrogen)  ?? Tell any doctor or dentist who treats you that you are using this medicine.  You may need to stop using this medicine several days before you have surgery or medical tests. You may also need to stop if you will be inactive for a long time.  ?? This medicine may make you dizzy or drowsy. Do not drive or do anything else that could be dangerous until you know how this medicine affects you.  ?? Tell any doctor or dentist who treats you that you are using this medicine. This medicine may affect certain medical test results.   ?? Your doctor will check your progress and the effects of this medicine at regular visits. Keep all appointments.  ?? Keep all medicine out of the reach of children. Never share your medicine with anyone.  Possible Side Effects While Using This Medicine:   Call your doctor right away if you notice any of these side effects:  ?? Allergic reaction: Itching or hives, swelling in your face or hands, swelling or tingling in your mouth or throat, chest tightness, trouble breathing  ?? Breast lumps  ?? Chest pain, trouble breathing, or coughing up blood  ?? Depression  ?? Heavy or nonstop vaginal bleeding  ?? Numbness or weakness on one side of your body, sudden or severe headache, problems with vision, speech, or walking  ?? Rapid weight gain, swelling in your hands, ankles, or feet  If you notice these less serious side effects, talk with your doctor:   ?? Breast pain or tenderness  ?? Light breakthrough bleeding or spotting  ?? Stomach bloating or cramps  ?? Warmth or redness in your face, neck, arms, or upper chest  If you notice other side effects that you think are caused by this medicine, tell your doctor.   Call your doctor for medical advice about side effects. You may report side effects to FDA at 1-800-FDA-1088  ?? 2014 [facility] Information is for End User's use only and may not be sold, redistributed or otherwise used for commercial purposes.  The above information is an educational aid only. It is not intended as medical advice for individual conditions or treatments. Talk to your doctor, nurse or pharmacist before following any medical regimen to see if it is safe and effective for you.
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
## 2013-08-21 NOTE — Progress Notes (Signed)
Chief Complaint   Patient presents with   ??? Establish Care     .1. Have you been to the ER, urgent care clinic since your last visit?  Hospitalized since your last visit?No    2. Have you seen or [facility] any other [facility] since your last visit?  Include any pap smears or colon screening. [facility] for pain management.

## 2013-10-06 NOTE — Telephone Encounter (Signed)
Called home and mobile number. We have the wrong mobile number. LM for patient to return call to office on home number

## 2013-10-17 MED ORDER — ESTRADIOL 0.05 MG/24 HR WEEKLY TRANSDERM PATCH
0.05 mg/24 hr | MEDICATED_PATCH | TRANSDERMAL | Status: DC
Start: 2013-10-17 — End: 2014-08-03

## 2013-11-03 ENCOUNTER — Ambulatory Visit: Admit: 2013-11-03 | Discharge: 2013-11-03 | Payer: MEDICARE | Attending: Family | Primary: Family

## 2013-11-03 DIAGNOSIS — H6693 Otitis media, unspecified, bilateral: Secondary | ICD-10-CM

## 2013-11-03 MED ORDER — NEOMYCIN-POLYMYXIN-HC 3.5 MG-10,000 UNIT/ML-1 % EAR DROPS, SUSP
3.5-10000-1 mg/mL-unit/mL-% | Freq: Four times a day (QID) | OTIC | Status: DC
Start: 2013-11-03 — End: 2014-08-03

## 2013-11-03 MED ORDER — AZITHROMYCIN 250 MG TAB
250 mg | ORAL_TABLET | ORAL | Status: DC
Start: 2013-11-03 — End: 2014-08-03

## 2013-11-03 NOTE — Patient Instructions (Signed)
Swimmer's Ear: After Your Visit  Your Care Instructions     Swimmer's ear (otitis externa) is inflammation or infection of the ear canal. This is the passage that leads from the outer ear to the eardrum. Any water, sand, or other debris that gets into the ear canal and stays there can cause swimmer's ear. Putting cotton swabs or other items in the ear to clean it can also cause this problem.  Swimmer's ear can be very painful. But you can treat the pain and infection with medicines. You should feel better in a few days.  Follow-up care is a key part of your treatment and safety. Be sure to make and go to all appointments, and call your doctor if you are having problems. It's also a good idea to know your test results and keep a list of the medicines you take.  How can you care for yourself at home?  Cleaning and care  ?? Use antibiotic drops as your doctor directs.  ?? Do not insert ear drops (other than the antibiotic ear drops) or anything else into the ear unless your doctor has told you to.  ?? Avoid getting water in the ear until the problem clears up. Use cotton lightly coated with petroleum jelly as an earplug. Do not use plastic earplugs.  ?? Use a hair dryer set on low to carefully dry the ear after you shower.  ?? To ease ear pain, hold a warm washcloth against your ear.  ?? Take pain medicines exactly as directed.  ?? If the doctor gave you a prescription medicine for pain, take it as prescribed.  ?? If you are not taking a prescription pain medicine, ask your doctor if you can take an over-the-counter medicine.  Inserting ear drops  ?? Warm the drops to body temperature by rolling the container in your hands. Or you can place it in a cup of warm water for a few minutes.  ?? Lie down, with your ear facing up.  ?? Place drops inside the ear. Follow your doctor's instructions (or the directions on the label) for how many drops to use. Gently wiggle the  outer ear or pull the ear up and back to help the drops get into the ear.  ?? It's important to keep the liquid in the ear canal for 3 to 5 minutes.  When should you call for help?  Call your doctor now or seek immediate medical care if:  ?? You have a new or higher fever.  ?? You have new or worse pain, swelling, warmth, or redness around or behind your ear.  ?? You have new or increasing pus or blood draining from your ear.  Watch closely for changes in your health, and be sure to contact your doctor if:  ?? You are not getting better after 2 days (48 hours).   Where can you learn more?   Go to http://www.healthwise.net/[facility]  Enter C706 in the search box to learn more about "Swimmer's Ear: After Your Visit."   ?? 2006-2015 Healthwise, Incorporated. Care instructions adapted under license by [employer] (which disclaims liability or warranty for this information). This care instruction is for use with your licensed healthcare professional. If you have questions about a medical condition or this instruction, always ask your healthcare professional. Healthwise, Incorporated disclaims any warranty or liability for your use of this information.  Content Version: 10.5.422740; Current as of: November 25, 2012                Keeping Ears Dry: After Your Visit  Your Care Instructions  Your doctor wants you to keep water from getting into your ears. You may need to do this because of a ruptured eardrum, an ear infection, or other ear problems.  Follow-up care is a key part of your treatment and safety. Be sure to make and go to all appointments, and call your doctor if you are having problems. It???s also a good idea to know your test results and keep a list of the medicines you take.  How can you care for yourself at home?  ?? Take baths until your doctor says you can take showers again. Avoid getting water in the ear until after the problem clears up. Ask your doctor if you should use earplugs to keep water out of your ears.   ?? Do not swim until your doctor says you can.  ?? If you get water in your ears, turn your head to each side and pull the earlobe in different directions. This will help the water run out. If your ears are still wet, use a hair dryer set on the lowest heat. Hold the dryer several inches from your ear.  ?? Use your medicines exactly as prescribed. Call your doctor if you think you are having a problem with your medicine. Do not put drops in your ears unless your doctor prescribes them.  When should you call for help?  Watch closely for changes in your health, and be sure to contact your doctor if:  ?? You get water in your ears and you cannot get it out.   Where can you learn more?   Go to [email or web address]://www.[email or web address]/[email or web address]  Enter ([phone])341-7602Z972 in the search box to learn more about "Keeping Ears Dry: After Your Visit."   ?? 2006-2015 Healthwise, Incorporated. Care instructions adapted under license by [employer] (which disclaims liability or warranty for this information). This care instruction is for use with your licensed healthcare professional. If you have questions about a medical condition or this instruction, always ask your healthcare professional. Healthwise, Incorporated disclaims any warranty or liability for your use of this information.  Content Version: 10.5.422740; Current as of: November 25, 2012              Ear Infection (Otitis Media): After Your Visit  Your Care Instructions     An ear infection may start with a cold and affect the middle ear (otitis media). It can hurt a lot. Most ear infections clear up on their own in a couple of days. Most often you will not need antibiotics. This is because many ear infections are caused by a virus. Antibiotics don't work against a virus. Regular doses of pain medicines are the best way to reduce your fever and help you feel better.  Follow-up care is a key part of your treatment and safety. Be sure to make  and go to all appointments, and call your doctor if you are having problems. It's also a good idea to know your test results and keep a list of the medicines you take.  How can you care for yourself at home?  ?? Take pain medicines exactly as directed.  ?? If the doctor gave you a prescription medicine for pain, take it as prescribed.  ?? If you are not taking a prescription pain medicine, take an over-the-counter medicine, such as acetaminophen (Tylenol), ibuprofen (Advil, Motrin), or naproxen (Aleve). Read and follow all instructions on the label.  ?? Do not take two  or more pain medicines at the same time unless the doctor told you to. Many pain medicines have acetaminophen, which is Tylenol. Too much acetaminophen (Tylenol) can be harmful.  ?? Plan to take a full dose of pain reliever before bedtime. Getting enough sleep will help you get better.  ?? Try a warm, moist washcloth on the ear. It may help relieve pain.  ?? If your doctor prescribed antibiotics, take them as directed. Do not stop taking them just because you feel better. You need to take the full course of antibiotics.  When should you call for help?  Call your doctor now or seek immediate medical care if:  ?? You have new or increasing ear pain.  ?? You have new or increasing pus or blood draining from your ear.  ?? You have a fever with a stiff neck or a severe headache.  Watch closely for changes in your health, and be sure to contact your doctor if:  ?? You have new or worse symptoms.  ?? You are not getting better after taking an antibiotic for 2 days.   Where can you learn more?   Go to [email or web address]  Enter [phone] [id number] in the search box to learn more about "Ear Infection (Otitis Media): After Your Visit."   ?? 2006-2015 Healthwise, Incorporated. Care instructions adapted under license by [employer] (which disclaims liability or warranty for this information). This care instruction is for use with your licensed  healthcare professional. If you have questions about a medical condition or this instruction, always ask your healthcare professional. Healthwise, Incorporated disclaims any warranty or liability for your use of this information.  Content Version: 10.5.422740; Current as of: November 25, 2012
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
## 2013-11-21 ENCOUNTER — Encounter: Attending: Family | Primary: Family

## 2013-11-24 NOTE — Progress Notes (Signed)
[facility], one pain notation, and [facility]     Right breast mass, mammogram done showed BI-RADs, done 12/01/2012, repeat needed 12/01/2013

## 2014-08-03 ENCOUNTER — Ambulatory Visit: Admit: 2014-08-03 | Discharge: 2014-08-03 | Payer: MEDICARE | Attending: Family | Primary: Family

## 2014-08-03 DIAGNOSIS — R635 Abnormal weight gain: Secondary | ICD-10-CM

## 2014-08-03 MED ORDER — AZITHROMYCIN 250 MG TAB
250 mg | ORAL_TABLET | ORAL | Status: AC
Start: 2014-08-03 — End: 2014-08-08
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
## 2014-08-03 NOTE — Patient Instructions (Addendum)
Please call insurance company and see who they recommend for pain management. I will put in referral when you get appointment. I have provided one name and can investigate more names if this does not work out. Please call your previous pain specialist and let him know you are running out of medications and find out what he recommends.      Anxiety Disorder: Care Instructions  Your Care Instructions  Anxiety is a normal reaction to stress. Difficult situations can cause you to have symptoms such as sweaty palms and a nervous feeling.  In an anxiety disorder, the symptoms are far more severe. Constant worry, muscle tension, trouble sleeping, nausea and diarrhea, and other symptoms can make normal daily activities difficult or impossible. These symptoms may occur for no reason, and they can affect your work, school, or social life. Medicines, counseling, and self-care can all help.  Follow-up care is a key part of your treatment and safety. Be sure to make and go to all appointments, and call your doctor if you are having problems. It's also a good idea to know your test results and keep a list of the medicines you take.  How can you care for yourself at home?  ?? Take medicines exactly as directed. Call your doctor if you think you are having a problem with your medicine.  ?? Go to your counseling sessions and follow-up appointments.  ?? Recognize and accept your anxiety. Then, when you are in a situation that makes you anxious, say to yourself, "This is not an emergency. I feel uncomfortable, but I am not in danger. I can keep going even if I feel anxious."  ?? Be kind to your body:  ?? Relieve tension with exercise or a massage.  ?? Get enough rest.  ?? Avoid alcohol, caffeine, nicotine, and illegal drugs. They can increase your anxiety level and cause sleep problems.  ?? Learn and do relaxation techniques. See below for more about these techniques.  ?? Engage your mind. Get out and do something you enjoy. Go to a funny  movie, or take a walk or hike. Plan your day. Having too much or too little to do can make you anxious.  ?? Keep a record of your symptoms. Discuss your fears with a good friend or family member, or join a support group for people with similar problems. Talking to others sometimes relieves stress.  ?? Get involved in social groups, or volunteer to help others. Being alone sometimes makes things seem worse than they are.  ?? Get at least 30 minutes of exercise on most days of the week to relieve stress. Walking is a good choice. You also may want to do other activities, such as running, swimming, cycling, or playing tennis or team sports.  Relaxation techniques  Do relaxation exercises 10 to 20 minutes a day. You can play soothing, relaxing music while you do them, if you wish.  ?? Tell others in your house that you are going to do your relaxation exercises. Ask them not to disturb you.  ?? Find a comfortable place, away from all distractions and noise.  ?? Lie down on your back, or sit with your back straight.  ?? Focus on your breathing. Make it slow and steady.  ?? Breathe in through your nose. Breathe out through either your nose or mouth.  ?? Breathe deeply, filling up the area between your navel and your rib cage. Breathe so that your belly goes up and down.  ??  Do not hold your breath.  ?? Breathe like this for 5 to 10 minutes. Notice the feeling of calmness throughout your whole body.  As you continue to breathe slowly and deeply, relax by doing the following for another 5 to 10 minutes:  ?? Tighten and relax each muscle group in your body. You can begin at your toes and work your way up to your head.  ?? Imagine your muscle groups relaxing and becoming heavy.  ?? Empty your mind of all thoughts.  ?? Let yourself relax more and more deeply.  ?? Become aware of the state of calmness that surrounds you.  ?? When your relaxation time is over, you can bring yourself back to  alertness by moving your fingers and toes and then your hands and feet and then stretching and moving your entire body. Sometimes people fall asleep during relaxation, but they usually wake up shortly afterward.  ?? Always give yourself time to return to full alertness before you drive a car or do anything that might cause an accident if you are not fully alert. Never play a relaxation tape while you drive a car.  When should you call for help?  Call 911 anytime you think you may need emergency care. For example, call if:  ?? You feel you cannot stop from hurting yourself or someone else.  Keep the numbers for these national suicide hotlines: 1-800-273-TALK (716)245-6115) and 1-800-SUICIDE (650) 290-4245). If you or someone you know talks about suicide or feeling hopeless, get help right away.  Watch closely for changes in your health, and be sure to contact your doctor if:  ?? You have anxiety or fear that affects your life.  ?? You have symptoms of anxiety that are new or different from those you had before.  Where can you learn more?  Go to [employer].at  Enter P754 in the search box to learn more about "Anxiety Disorder: Care Instructions."  ?? 2006-2016 Healthwise, Incorporated. Care instructions adapted under license by [employer] (which disclaims liability or warranty for this information). This care instruction is for use with your licensed healthcare professional. If you have questions about a medical condition or this instruction, always ask your healthcare professional. [place] any warranty or liability for your use of this information.  Content Version: 10.9.538570; Current as of: December 01, 2013

## 2014-08-03 NOTE — Progress Notes (Signed)
Chief Complaint   Patient presents with   ??? Weight Gain   ??? Ear Pain     hx of chronic ear disease.      1. Have you been to the ER, urgent care clinic since your last visit?  Hospitalized since your last visit?No    2. Have you seen or consulted any other health care providers outside of the [facility] since your last visit?  Include any pap smears or colon screening. No

## 2014-08-04 LAB — CBC WITH AUTOMATED DIFF
ABS. BASOPHILS: 0.1 10*3/uL (ref 0.0–0.2)
ABS. EOSINOPHILS: 0.5 10*3/uL — ABNORMAL HIGH (ref 0.0–0.4)
ABS. IMM. GRANS.: 0 10*3/uL (ref 0.0–0.1)
ABS. MONOCYTES: 0.7 10*3/uL (ref 0.1–0.9)
ABS. NEUTROPHILS: 4.5 10*3/uL (ref 1.4–7.0)
Abs Lymphocytes: 4.5 10*3/uL — ABNORMAL HIGH (ref 0.7–3.1)
BASOPHILS: 1 %
EOSINOPHILS: 5 %
HCT: 46.2 % (ref 34.0–46.6)
HGB: 15.6 g/dL (ref 11.1–15.9)
IMMATURE GRANULOCYTES: 0 %
Lymphocytes: 44 %
MCH: 31.3 pg (ref 26.6–33.0)
MCHC: 33.8 g/dL (ref 31.5–35.7)
MCV: 93 fL (ref 79–97)
MONOCYTES: 7 %
NEUTROPHILS: 43 %
PLATELET: 259 10*3/uL (ref 150–379)
RBC: 4.98 x10E6/uL (ref 3.77–5.28)
RDW: 13.8 % (ref 12.3–15.4)
WBC: 10.4 10*3/uL (ref 3.4–10.8)

## 2014-08-04 LAB — METABOLIC PANEL, COMPREHENSIVE
A-G Ratio: 1.4 (ref 1.1–2.5)
ALT (SGPT): 19 IU/L (ref 0–32)
AST (SGOT): 22 IU/L (ref 0–40)
Albumin: 4.3 g/dL (ref 3.5–5.5)
Alk. phosphatase: 102 IU/L (ref 39–117)
BUN/Creatinine ratio: 19 (ref 9–23)
BUN: 16 mg/dL (ref 6–24)
Bilirubin, total: 0.4 mg/dL (ref 0.0–1.2)
CO2: 24 mmol/L (ref 18–29)
Calcium: 9.4 mg/dL (ref 8.7–10.2)
Chloride: 99 mmol/L (ref 97–108)
Creatinine: 0.85 mg/dL (ref 0.57–1.00)
GFR est AA: 98 mL/min/{1.73_m2} (ref >59)
GFR est non-AA: 85 mL/min/{1.73_m2} (ref >59)
GLOBULIN, TOTAL: 3 g/dL (ref 1.5–4.5)
Glucose: 127 mg/dL — ABNORMAL HIGH (ref 65–99)
Potassium: 4 mmol/L (ref 3.5–5.2)
Protein, total: 7.3 g/dL (ref 6.0–8.5)
Sodium: 141 mmol/L (ref 134–144)

## 2014-08-04 LAB — TSH 3RD GENERATION: TSH: 1.45 u[IU]/mL (ref 0.450–4.500)

## 2014-08-06 NOTE — Telephone Encounter (Signed)
LM with lab results, will send letter also

## 2014-08-06 NOTE — Telephone Encounter (Signed)
Patient would like a call back to discuss lab results from 7.22.16 visit.

## 2014-08-07 NOTE — Telephone Encounter (Signed)
-----   Message from Renaldo Harrison sent at 08/06/2014 12:41 PM EDT -----  Regarding: Np Skiff/telephone  Pt would like a call back with lab results. Best contact [phone]

## 2014-08-07 NOTE — Telephone Encounter (Signed)
Patients labs reviewed. Will add on vitamin D level to labs. She states she is still exhausted, discussed role of stress and exhaustion. She will be in touch should symptoms continue

## 2014-08-07 NOTE — Telephone Encounter (Signed)
Left message again. Will try again later

## 2014-08-10 ENCOUNTER — Encounter

## 2014-08-10 DIAGNOSIS — M545 Low back pain: Secondary | ICD-10-CM

## 2014-08-10 NOTE — Telephone Encounter (Signed)
Put in order for Vitamin D, called patient to let her know she can go to any [facility].

## 2014-08-10 NOTE — ED Notes (Signed)
Pt. States was standing up from her recliner and twisted causing severe sharp stabbing shooting pain down her lower back, states then legs gave out. States legs feel numb.

## 2014-08-11 ENCOUNTER — Inpatient Hospital Stay
Admit: 2014-08-11 | Discharge: 2014-08-11 | Disposition: A | Discharge status: Home / Self Care | Payer: MEDICARE | Attending: Emergency Medicine

## 2014-08-11 MED ORDER — HYDROMORPHONE (PF) 1 MG/ML IJ SOLN
1 mg/mL | INTRAMUSCULAR | Status: AC
Start: 2014-08-11 — End: 2014-08-11
  Administered 2014-08-11: 04:00:00 via INTRAMUSCULAR

## 2014-08-11 MED ORDER — LIDOCAINE 5 % (700 MG/PATCH) ADHESIVE PATCH
5 % | CUTANEOUS | Status: DC
Start: 2014-08-11 — End: 2014-08-11

## 2014-08-11 MED ORDER — DIAZEPAM 5 MG TAB
5 mg | ORAL | Status: AC
Start: 2014-08-11 — End: 2014-08-11
  Administered 2014-08-11: 04:00:00 via ORAL

## 2014-08-11 MED ORDER — ACETAMINOPHEN 500 MG TAB
500 mg | ORAL | Status: DC
Start: 2014-08-11 — End: 2014-08-11

## 2014-08-11 MED FILL — DIAZEPAM 5 MG TAB: 5 mg | ORAL | Qty: 2

## 2014-08-11 MED FILL — ACETAMINOPHEN 500 MG TAB: 500 mg | ORAL | Qty: 2

## 2014-08-11 MED FILL — HYDROMORPHONE (PF) 1 MG/ML IJ SOLN: 1 mg/mL | INTRAMUSCULAR | Qty: 1

## 2014-08-11 MED FILL — LIDODERM 5 % TOPICAL PATCH: 5 % | CUTANEOUS | Qty: 1

## 2014-08-11 NOTE — ED Notes (Signed)
Patient verbalizes pain and wants more pain medication. Provider aware.

## 2014-08-11 NOTE — ED Provider Notes (Signed)
HPI Comments: Shannon [name] is a 39 y.o. female with hx significant for chronic back pain who presents via EMS to ER with c/o acute onset low back pain x PTA. Pt states that she was going to stand up from the recliner when her foot got caught in the blanket causing her lower back to twist suddenly upon standing. States she heard a loud "pop" as she twisted followed by acute onset severe, sharp pain in lower back. Notes pain was so severe "I couldn't stand up" and slid to the ground. States she was unable to stand back up due to the pain. Pain significantly worse with any movement. Notes tingling sensation in bilateral lower legs. Denies any bowel/bladder incontinence. Able to move legs/walk but states it causes severe pain so she is reluctant to do so. Denies taking anything prior to coming to ER. Patient sees pain management and has a pain contract.   She specifically denies any fevers, chills, nausea, vomiting, chest pain, shortness of breath, headache, rash, diarrhea, abdominal pain, urinary/bowel changes, sweating or weight loss.    PCP: Phys Other, MD   PMHx significant for: Past Medical History:    Psychiatric disorder                                            Comment:PTSD, anxiety,cutting    Arthritis                                                     Other ill-defined conditions(799.89)                            Comment:fibromyalgia    Other ill-defined conditions(799.89)                            Comment:chronic ear disease     Other ill-defined conditions(799.89)                            Comment:toxic mildew syndrome    Other ill-defined conditions(799.89)                            Comment:lyme disease    Other ill-defined conditions(799.89)                            Comment:degenerative disc    Breast nodule                                                 Seizures (HCC)                                                  Comment:Last seizure was in 2012     PSHx significant for: Past Surgical History:  HX ORTHOPAEDIC                                                   Comment:left elbow ORIF    HX CHOLECYSTECTOMY                                             HX HEENT                                                       HX GYN                                                           Comment:BTL        -- Aspirin -- Unknown (comments)    --  Mother told her   -- Benadryl (Diphenhydramine Hcl) -- Palpitations   -- Ciprofloxacin -- Rash   -- Darvocet A500 (Propoxyphene N-Acetaminophen) -- Nausea and Vomiting   -- Ketorolac -- Anxiety   -- Pcn (Penicillins) -- Rash   -- Sulfa (Sulfonamide Antibiotics) -- Rash    There are no other complaints, changes or physical findings at this time.        The history is provided by the patient.        Past Medical History:   Diagnosis Date   ??? Psychiatric disorder      PTSD, anxiety,cutting   ??? Arthritis    ??? Other ill-defined conditions(799.89)      fibromyalgia   ??? Other ill-defined conditions(799.89)      chronic ear disease    ??? Other ill-defined conditions(799.89)      toxic mildew syndrome   ??? Other ill-defined conditions(799.89)      lyme disease   ??? Other ill-defined conditions(799.89)      degenerative disc   ??? Breast nodule    ??? Seizures (HCC)      Last seizure was in 2012       Past Surgical History:   Procedure Laterality Date   ??? Hx orthopaedic       left elbow ORIF   ??? Hx cholecystectomy     ??? Hx heent     ??? Hx gyn       BTL         Family History:   Problem Relation Age of Onset   ??? Depression     ??? Cancer Mother      lung   ??? Heart Disease Mother    ??? COPD Mother    ??? Thyroid Disease Mother        History     Social History   ??? Marital Status: SINGLE     Spouse Name: N/A   ??? Number of Children: N/A   ??? Years of Education: N/A     Occupational History   ??? disabled Not Employed     Social  History Main Topics   ??? Smoking status: Former Smoker -- 0.25 packs/day for 20 years     Types: Cigarettes    ??? Smokeless tobacco: Never Used      Comment: used to smoke 2ppd, now down to 1/2ppd( 06/2011)   ??? Alcohol Use: No   ??? Drug Use: No   ??? Sexual Activity:     Partners: Male     Other Topics Concern   ??? Not on file     Social History Narrative         ALLERGIES: Aspirin; Benadryl; Ciprofloxacin; Darvocet a500; Ketorolac; Pcn; and Sulfa (sulfonamide antibiotics)    Review of Systems   Constitutional: Negative.  Negative for fever, chills, appetite change and fatigue.   HENT: Negative.  Negative for congestion and sore throat.    Eyes: Negative.  Negative for visual disturbance.   Respiratory: Negative.  Negative for cough and shortness of breath.    Cardiovascular: Negative.  Negative for chest pain, palpitations and leg swelling.   Gastrointestinal: Negative.  Negative for nausea, vomiting, abdominal pain, diarrhea and constipation.   Genitourinary: Negative.  Negative for dysuria, hematuria and flank pain.   Musculoskeletal: Positive for back pain. Negative for neck pain.   Skin: Negative.  Negative for rash.   Neurological: Positive for numbness. Negative for dizziness, syncope, weakness and headaches.   Hematological: Negative.    Psychiatric/Behavioral: Negative.    All other systems reviewed and are negative.      Filed Vitals:    08/10/14 2329 08/10/14 2330 08/11/14 0000 08/11/14 0057   BP: 114/72 109/75 115/75 107/79   Pulse: 64 65 58 58   Temp: 97.6 ??F (36.4 ??C)      Resp: 18 16 19 17    Height: 5\' 4"  (1.626 m)      Weight: 81.647 kg (180 lb)      SpO2: 99% 99% 97% 97%            Physical Exam   Constitutional: She is oriented to person, place, and time. She appears well-developed and well-nourished. She appears distressed (uncomfortable appearing).   HENT:   Head: Normocephalic and atraumatic.   Neck: Normal range of motion.   Cardiovascular: Normal rate, normal heart sounds, intact distal pulses and normal pulses.  Exam reveals no gallop and no friction rub.    No murmur heard.   Pulmonary/Chest: Effort normal and breath sounds normal. No respiratory distress. She has no wheezes. She has no rales.   Abdominal: Soft. Bowel sounds are normal. She exhibits no distension. There is no tenderness. There is no rebound and no guarding.   Musculoskeletal: Normal range of motion. She exhibits no edema or tenderness.   Midline lumbar spinal tenderness to palpation, no crepitus or stepoff deformity. No midline cervical or thoracic spinal tenderness. Positive SLR bilaterally. Decreased ROM lower extremities secondary to pain on initial exam. Strength equal bilaterally. NVI bilateral LE, brisk cap refill, DP pulse 2+.    Neurological: She is alert and oriented to person, place, and time. She has normal strength. No sensory deficit.   Skin: Skin is warm and dry. No rash noted. She is not diaphoretic. No erythema. No pallor.   Psychiatric: She has a normal mood and affect. Her behavior is normal.   Nursing note and vitals reviewed.       MDM  Number of Diagnoses or Management Options  Acute midline low back pain without sciatica:   Diagnosis management comments: DDx: low back sprain, strain,  spasm, AAA       Amount and/or Complexity of Data Reviewed  Tests in the radiology section of CPT??: ordered and reviewed  Discuss the patient with other providers: yes (Dr. Maricela Curet)    Patient Progress  Patient progress: stable      Procedures             12:35 AM   Zadie Rhine, PA-C discussed patient with Pierre Bali, MD who is in agreement with care plan as outlined. Further recommends CT abd/pelv wo contrast to assess for further injury, specifically AAA. No additional pain medicine in ER. Zadie Rhine, PA-C       1:55 AM  Pt has been reevaluated. There are no new complaints, changes, or physical findings at this time. Medications have been reviewed w/ pt and/or family. Pt and/or family's questions have been answered. Pt and/or family  expressed good understanding of the dx/tx/rx and is in agreement with plan of care. Pt instructed and agreed to f/u w/ PCP, pain management and to return to ED upon further deterioration. Pt is ready for discharge.    LABORATORY TESTS:  No results found for this or any previous visit (from the past 12 hour(s)).    IMAGING RESULTS:  CT ABD PELV WO CONT   Final Result      XR SPINE LUMB 2 OR 3 V   Final Result         CT ABD PELV WO CONT (Final result) Result time: 08/11/14 01:30:44   ?? Final result by Rad Results In Edi (08/11/14 01:30:44)   ?? Narrative:   ?? **Final Report**  ??    ICD Codes / Adm.Diagnosis: 12 ??75 / Back Pain ??Numbness  Examination: ??CT ABD PELVIS WO CON ??- [id number] - Aug 11 2014 ??1:15AM  Accession No: ??[id number]  Reason: ??back pain, hypotension; r/o AAA      REPORT:  INDICATION: ?? back pain, hypotension; r/o AAA Back Pain Numbness    COMPARISON: ??September 16, 2012    TECHNIQUE: 5 mm axial images were obtained through the abdomen and pelvis   with no oral or intravenous contrast material. ??Multiplanar reconstructions   were generated. ??CT dose reduction was achieved through use of a   standardized protocol tailored for this examination and automatic exposure   control for dose modulation.    FINDINGS:     The lung bases are clear. The liver is normal without focal lesion. ??The   gallbladder is surgically absent. ??The spleen and pancreas are normal. ??The   adrenal glands are normal. ??The kidneys are normal without calcification. ??  There is no hydronephrosis. ??There is no free intraperitoneal air or fluid. ??  There is no bowel wall thickening or dilatation. ??The appendix is normal. ??  The abdominal aorta is normal in caliber.    The urinary bladder is normal. ??There is no pelvic lymphadenopathy. ??No   significant free fluid in the pelvis. Uterus is present. The osseous   structures are unremarkable.  ????    IMPRESSION:    No acute process or significant interval change.  ????    ??  ??   Signing/Reading Doctor: Artelia Laroche LEWIS [phone]) ??  ApprovedArtelia Laroche LEWIS [phone]) ??Aug 11 2014 ??1:28AM ?? ?? ?? ?? ?? ?? ?? ?? ?? ?? ??       ??   ??    ??    ?? XR SPINE LUMB 2 OR 3 V (Final result) Result time: 08/11/14  00:41:33   ?? Final result by Rad Results In Edi (08/11/14 00:41:33)   ?? Narrative:   ?? **Final Report**  ??    ICD Codes / Adm.Diagnosis: 12 ??75 / Back Pain ??Numbness  Examination: ??CR L SPINE 2 OR 3 VWS ??- [id number] - Aug 11 2014 12:28AM  Accession No: ??[id number]  Reason: ??lower back pain      REPORT:  INDICATION: ?? lower back pain Back Pain Numbness    COMPARISON: Jun 08, 2012    FINDINGS:    AP, lateral, and coned down lateral views of the lumbar spine demonstrate no   acute fracture or subluxation. There are no significant degenerative   changes. The sacroiliac joints are intact.  ????    IMPRESSION:  No acute process.   ????    ??  ??  Signing/Reading Doctor: Artelia Laroche LEWIS [phone]) ??  ApprovedArtelia Laroche LEWIS [phone]) ??Aug 11 2014 12:39AM ?? ?? ?? ?? ?? ?? ?? ?? ?? ?? ??              MEDICATIONS GIVEN:  Medications   lidocaine (LIDODERM) 5 % patch 1 Patch (not administered)   diazepam (VALIUM) tablet 10 mg (10 mg Oral Given 08/11/14 0005)   HYDROmorphone (PF) (DILAUDID) injection 1 mg (1 mg IntraMUSCular Given 08/11/14 0003)       IMPRESSION:  1. Acute midline low back pain without sciatica        PLAN:  1.   Current Discharge Medication List      CONTINUE these medications which have NOT CHANGED    Details   Morphine (KADIAN) 60 mg ER capsule Take 30 mg by mouth daily.      morphine CR (MS CONTIN) 30 mg CR tablet       pregabalin (LYRICA) 100 mg capsule Take 100 mg by mouth three (3) times daily.           2.   Follow-up Information     Follow up With Details Comments Contact Info    Your Pain Management Physician Schedule an appointment as soon as possible for a visit to refill prescriptions     [facility] EMERGENCY DEPT  If symptoms worsen [address]  [place] [address]  [phone]     Vania Rea, MD Schedule an appointment as soon as possible for a visit PAIN MANAGEMENT [address] [facility]  [place] [address]  [phone]              Return to ED if worse

## 2014-08-11 NOTE — ED Notes (Signed)
I have reviewed discharge instructions with the patient and spouse.  The patient and spouse verbalized understanding. Pt confirmed understanding of need for follow up with primary care provider.    Pt is not in any current distress and shows no evidence of clinical instability. Pt left by wheelchair.  Pt family/friends are present. Pt left with all personal belongings.  Pt states they are not driving.     Pt states chief complaint has improved with treatment provided.    PT is alert and oriented x 4, Pt is hemodynamically/respiratorily stable.      Paperwork given by provider and reviewed with patient, opportunity for questions/clarification given.

## 2014-09-03 ENCOUNTER — Telehealth

## 2014-09-03 ENCOUNTER — Encounter

## 2014-09-03 MED ORDER — ALPRAZOLAM 0.5 MG TAB
0.5 mg | ORAL_TABLET | Freq: Three times a day (TID) | ORAL | 0 refills | Status: DC | PRN
Start: 2014-09-03 — End: 2014-09-11

## 2014-09-03 MED ORDER — BUSPIRONE 7.5 MG TAB
7.5 mg | ORAL_TABLET | ORAL | 0 refills | Status: DC
Start: 2014-09-03 — End: 2014-09-11

## 2014-09-03 MED ORDER — BUSPIRONE 7.5 MG TAB
7.5 mg | ORAL_TABLET | Freq: Two times a day (BID) | ORAL | 0 refills | Status: DC
Start: 2014-09-03 — End: 2014-09-03

## 2014-09-03 NOTE — Telephone Encounter (Signed)
Patient states her husband has been told that he is terminal and there is no treatment. She states she cannot sleep, she feels like her anxiety is "through the roof".  She is requesting new anxiety medication to help with her symptoms.     Discussed xanax and buspar. She would like to start both. She will follow up as soon as her she can. Husband currently in hospital and will be discharged to hospice.

## 2014-09-03 NOTE — Telephone Encounter (Signed)
Patient requested to speak with Shannon [name] regarding her husband, [name], health condition. Patient was informed that all resources have been exhausted, and there's nothing else they can do for Mr. [name]. The patient is having a hard time dealing with the results. Patient is experiencing anxiety and lack of sleep. Patient stated, she needs something to calm her nerves. Please contact patient at [phone].

## 2014-09-10 ENCOUNTER — Telehealth

## 2014-09-10 ENCOUNTER — Encounter

## 2014-09-10 NOTE — Telephone Encounter (Signed)
Patient explained that the Buspar and Alprazolam medication is not working as desired. Patient would like to request for a stronger dosage for both drugs. Please advise!

## 2014-09-11 MED ORDER — ALPRAZOLAM 0.5 MG TAB
0.5 mg | ORAL_TABLET | Freq: Three times a day (TID) | ORAL | 0 refills | Status: DC | PRN
Start: 2014-09-11 — End: 2014-10-02

## 2014-09-11 MED ORDER — BUSPIRONE 10 MG TAB
10 mg | ORAL_TABLET | Freq: Two times a day (BID) | ORAL | 2 refills | Status: DC
Start: 2014-09-11 — End: 2014-10-15

## 2014-09-11 NOTE — Telephone Encounter (Signed)
Patient explained that she has been referred to Thane EduAmanda Hodges before and she does not prescribe Xanax. Patient stated, she's not crazy. She just needs something to get her over this hump. Patient [facility] to [facility].

## 2014-09-11 NOTE — Telephone Encounter (Signed)
Patient states she is not able to sleep or eat and cannot come in to an appointment because her "time is precious" with terminal partner. She states she has gone through 10 day supply of xanax and is taking as prescribed with no more than 0.5mg  three time daily. 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 She states she will come in for appointment at next opportunity.

## 2014-10-02 ENCOUNTER — Encounter

## 2014-10-02 MED ORDER — ALPRAZOLAM 0.5 MG TAB
0.5 mg | ORAL_TABLET | Freq: Three times a day (TID) | ORAL | 0 refills | Status: DC | PRN
Start: 2014-10-02 — End: 2014-10-15

## 2014-10-02 NOTE — Telephone Encounter (Signed)
Patient requested that Shannon [name] gives her call regarding her husband. Patient can reached at [phone].

## 2014-10-12 ENCOUNTER — Encounter

## 2014-10-12 NOTE — Telephone Encounter (Signed)
Patient requested a referral for Mental Health. Patient specifically asked for therapist, Keitha Butte. Please contact patient once the referral is done.

## 2014-10-12 NOTE — Telephone Encounter (Signed)
Called to check in on her since her husband's passing. She states she is taking care of things and preparing for his memorial service next week. She states she has some support with friends but that her children do not live in state.     She will schedule follow up appointment soon, I let her know to please contact us if there is anything we can do to help make this time easier for her.

## 2014-10-15 ENCOUNTER — Encounter

## 2014-10-15 MED ORDER — NEOMYCIN-POLYMYXIN-HC 3.5 MG-10,000 UNIT/ML-1 % EAR DROPS, SUSP
3.5-10000-1 mg/mL-unit/mL-% | Freq: Four times a day (QID) | OTIC | 0 refills | Status: DC
Start: 2014-10-15 — End: 2014-10-25

## 2014-10-15 MED ORDER — AZITHROMYCIN 250 MG TAB
250 mg | ORAL_TABLET | ORAL | 0 refills | Status: AC
Start: 2014-10-15 — End: 2014-10-20

## 2014-10-15 MED ORDER — ALPRAZOLAM 0.5 MG TAB
0.5 mg | ORAL_TABLET | Freq: Three times a day (TID) | ORAL | 0 refills | Status: DC | PRN
Start: 2014-10-15 — End: 2014-10-25

## 2014-10-15 MED ORDER — BUSPIRONE 10 MG TAB
10 mg | ORAL_TABLET | Freq: Two times a day (BID) | ORAL | 2 refills | Status: DC
Start: 2014-10-15 — End: 2014-10-25

## 2014-10-15 NOTE — Telephone Encounter (Signed)
Reviewed multiple issues with patient. She states she will come in for apt next Thurs at 11am. She IS taking xanax 0.5mg  TID and not PRN. Will refill this, buspar, and medications for her chronic ear infections.     Referral was previously placed for [facility].

## 2014-10-15 NOTE — Telephone Encounter (Signed)
-----   Message from Vella Raring sent at 10/15/2014  9:35 AM EDT -----  Regarding: Natalia Leatherwood Skiff,NP/prescription requests  Pt needs a prescription refill for the generic for Xanax  and needs an antibiotic (Z-Pak )for ears and chest congestion into the [facility] (p) [phone]. Please contact Gwen Reid,in home  counselor with [facility] to confirm that pt still needs to take Xanax. Jae Dire can be reached at (p) [phone] or (c) [phone] (f) [phone]. Pt would like to go back to [facility]. Pt needs a referral to see Shanda Bumps. The best number to reach the pt is [phone].

## 2014-10-15 NOTE — Telephone Encounter (Signed)
Patient would like for nurse practitioner regarding medication request. [phone]

## 2014-10-25 ENCOUNTER — Ambulatory Visit: Admit: 2014-10-25 | Discharge: 2014-10-25 | Payer: MEDICARE | Attending: Family | Primary: Family

## 2014-10-25 DIAGNOSIS — F411 Generalized anxiety disorder: Secondary | ICD-10-CM

## 2014-10-25 MED ORDER — ALPRAZOLAM 0.5 MG TAB
0.5 mg | ORAL_TABLET | Freq: Three times a day (TID) | ORAL | 0 refills | Status: AC | PRN
Start: 2014-10-25 — End: ?

## 2014-10-25 MED ORDER — ALBUTEROL SULFATE 0.083 % (0.83 MG/ML) SOLN FOR INHALATION
2.5 mg /3 mL (0.083 %) | Freq: Once | RESPIRATORY_TRACT | 0 refills | Status: AC
Start: 2014-10-25 — End: 2014-10-25

## 2014-10-25 MED ORDER — METHYLPREDNISOLONE 4 MG TABS IN A DOSE PACK
4 mg | ORAL | 0 refills | Status: AC
Start: 2014-10-25 — End: ?

## 2014-10-25 MED ORDER — BUSPIRONE 15 MG TAB
15 mg | ORAL_TABLET | Freq: Two times a day (BID) | ORAL | 2 refills | Status: AC
Start: 2014-10-25 — End: ?

## 2014-10-25 MED ORDER — DOXYCYCLINE 100 MG CAP
100 mg | ORAL_CAPSULE | Freq: Two times a day (BID) | ORAL | 0 refills | Status: AC
Start: 2014-10-25 — End: ?
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
## 2014-10-25 NOTE — Patient Instructions (Signed)
Recovering From Depression: Care Instructions  Your Care Instructions  Taking good care of yourself is important as you recover from depression. In time, your symptoms will fade as your treatment takes hold. Do not give up. Instead, focus your energy on getting better.  Your mood will improve. It just takes some time. Focus on things that can help you feel better, such as being with friends and family, eating well, and getting enough rest. But take things slowly. Do not do too much too soon. You will begin to feel better gradually.  Follow-up care is a key part of your treatment and safety. Be sure to make and go to all appointments, and call your doctor if you are having problems. It's also a good idea to know your test results and keep a list of the medicines you take.  How can you care for yourself at home?  Be realistic  ?? If you have a large task to do, break it up into smaller steps you can handle, and just do what you can.  ?? You may want to put off important decisions until your depression has lifted. If you have plans that will have a major impact on your life, such as marriage, divorce, or a job change, try to wait a bit. Talk it over with friends and loved ones who can help you look at the overall picture first.  ?? Reaching out to people for help is important. Do not isolate yourself. Let your family and friends help you. Find someone you can trust and confide in, and talk to that person.  ?? Be patient, and be kind to yourself. Remember that depression is not your fault and is not something you can overcome with willpower alone. Treatment is necessary for depression, just like for any other illness. Feeling better takes time, and your mood will improve little by little.  Stay active  ?? Stay busy and get outside. Take a walk, or try some other light exercise.  ?? Talk with your doctor about an exercise program. Exercise can help with mild depression.   ?? Go to a movie or concert. Take part in a church activity or other social gathering. Go to a ball game.  ?? Ask a friend to have dinner with you.  Take care of yourself  ?? Eat a balanced diet with plenty of fresh fruits and vegetables, whole grains, and lean protein. If you have lost your appetite, eat small snacks rather than large meals.  ?? Avoid drinking alcohol or using illegal drugs. Do not take medicines that have not been prescribed for you. They may interfere with medicines you may be taking for depression, or they may make your depression worse.  ?? Take your medicines exactly as they are prescribed. You may start to feel better within 1 to 3 weeks of taking antidepressant medicine. But it can take as many as 6 to 8 weeks to see more improvement. If you have questions or concerns about your medicines, or if you do not notice any improvement by 3 weeks, talk to your doctor.  ?? If you have any side effects from your medicine, tell your doctor. Antidepressants can make you feel tired, dizzy, or nervous. Some people have dry mouth, constipation, headaches, sexual problems, or diarrhea. Many of these side effects are mild and will go away on their own after you have been taking the medicine for a few weeks. Some may last longer. Talk to your doctor if side effects are   bothering you too much. You might be able to try a different medicine.  ?? Get enough sleep. If you have problems sleeping:  ?? Go to bed at the same time every night, and get up at the same time every morning.  ?? Keep your bedroom dark and quiet.  ?? Do not exercise after 5:00 p.m.  ?? Avoid drinks with caffeine after 5:00 p.m.  ?? Avoid sleeping pills unless they are prescribed by the doctor treating your depression. Sleeping pills may make you groggy during the day, and they may interact with other medicine you are taking.  ?? If you have any other illnesses, such as diabetes, heart disease, or  high blood pressure, make sure to continue with your treatment. Tell your doctor about all of the medicines you take, including those with or without a prescription.  ?? Keep the numbers for these national suicide hotlines: 1-800-273-TALK (316)218-7327(1-(660) 676-4524) and 1-800-SUICIDE 6311816282(1-5186151140). If you or someone you know talks about suicide or feeling hopeless, get help right away.  When should you call for help?  Call 911 anytime you think you may need emergency care. For example, call if:  ?? You feel like hurting yourself or someone else.  ?? Someone you know has depression and is about to attempt or is attempting suicide.  Call your doctor now or seek immediate medical care if:  ?? You hear voices.  ?? Someone you know has depression and:  ?? Starts to give away his or her possessions.  ?? Uses illegal drugs or drinks alcohol heavily.  ?? Talks or writes about death, including writing suicide notes or talking about guns, knives, or pills.  ?? Starts to spend a lot of time alone.  ?? Acts very aggressively or suddenly appears calm.  Watch closely for changes in your health, and be sure to contact your doctor if:  ?? You do not get better as expected.  Where can you learn more?  Go to InsuranceStats.cahttp://www.healthwise.net/GoodHelpConnections  Enter N529 in the search box to learn more about "Recovering From Depression: Care Instructions."  ?? 2006-2016 Healthwise, Incorporated. Care instructions adapted under license by [employer] (which disclaims liability or warranty for this information). This care instruction is for use with your licensed healthcare professional. If you have questions about a medical condition or this instruction, always ask your healthcare professional. Healthwise, Incorporated disclaims any warranty or liability for your use of this information.  Content Version: 11.0.578772; Current as of: December 01, 2013        Bronchitis: Care Instructions  Your Care Instructions      Bronchitis is inflammation of the bronchial tubes, which carry air to the lungs. The tubes swell and produce mucus, or phlegm. The mucus and inflamed bronchial tubes make you cough. You may have trouble breathing.  Most cases of bronchitis are caused by viruses like those that cause colds. Antibiotics usually do not help and they may be harmful.  Bronchitis usually develops rapidly and lasts about 2 to 3 weeks in otherwise healthy people.  Follow-up care is a key part of your treatment and safety. Be sure to make and go to all appointments, and call your doctor if you are having problems. It's also a good idea to know your test results and keep a list of the medicines you take.  How can you care for yourself at home?  ?? Take all medicines exactly as prescribed. Call your doctor if you think you are having a problem with your medicine.  ??  Get some extra rest.  ?? Take an over-the-counter pain medicine, such as acetaminophen (Tylenol), ibuprofen (Advil, Motrin), or naproxen (Aleve) to reduce fever and relieve body aches. Read and follow all instructions on the label.  ?? Do not take two or more pain medicines at the same time unless the doctor told you to. Many pain medicines have acetaminophen, which is Tylenol. Too much acetaminophen (Tylenol) can be harmful.  ?? Take an over-the-counter cough medicine that contains dextromethorphan to help quiet a dry, hacking cough so that you can sleep. Avoid cough medicines that have more than one active ingredient. Read and follow all instructions on the label.  ?? Breathe moist air from a humidifier, hot shower, or sink filled with hot water. The heat and moisture will thin mucus so you can cough it out.  ?? Do not smoke. Smoking can make bronchitis worse. If you need help quitting, talk to your doctor about stop-smoking programs and medicines. These can increase your chances of quitting for good.  When should you call for help?   Call 911 anytime you think you may need emergency care. For example, call if:  ?? You have severe trouble breathing.  Call your doctor now or seek immediate medical care if:  ?? You have new or worse trouble breathing.  ?? You cough up dark brown or bloody mucus (sputum).  ?? You have a new or higher fever.  ?? You have a new rash.  Watch closely for changes in your health, and be sure to contact your doctor if:  ?? You cough more deeply or more often, especially if you notice more mucus or a change in the color of your mucus.  ?? You are not getting better as expected.  Where can you learn more?  Go to InsuranceStats.ca  Enter H333 in the search box to learn more about "Bronchitis: Care Instructions."  ?? 2006-2016 Healthwise, Incorporated. Care instructions adapted under license by [employer] (which disclaims liability or warranty for this information). This care instruction is for use with your licensed healthcare professional. If you have questions about a medical condition or this instruction, always ask your healthcare professional. Healthwise, Incorporated disclaims any warranty or liability for your use of this information.  Content Version: 11.0.578772; Current as of: Jun 04, 2014

## 2014-11-05 ENCOUNTER — Emergency Department
Admission: EM | Admit: 2014-11-05 | Discharge: 2014-11-05 | Discharge status: Against Medical Advice | Payer: Self-pay | Attending: Emergency Medicine | Admitting: Emergency Medicine

## 2014-11-05 ENCOUNTER — Emergency Department: Payer: Self-pay

## 2014-11-05 DIAGNOSIS — Z720 Tobacco use: Secondary | ICD-10-CM | POA: Insufficient documentation

## 2014-11-05 DIAGNOSIS — J45901 Unspecified asthma with (acute) exacerbation: Secondary | ICD-10-CM | POA: Insufficient documentation

## 2014-11-05 HISTORY — DX: Anxiety disorder, unspecified: F41.9

## 2014-11-05 HISTORY — DX: Unspecified asthma, uncomplicated: J45.909

## 2014-11-05 NOTE — ED Notes (Signed)
Patient comes with shortness of breath.  Patient refuses to give any further information.

## 2014-11-05 NOTE — ED Notes (Signed)
Patient yelling at nurse and calling staff a bitch.  Security informed.

## 2014-11-05 NOTE — Telephone Encounter (Signed)
Patient called stating that she has bronchitis and trouble breathing. Patient was breathing heavy over the phone as she was speaking. PSR (Myself) informed nurse practitioner. Nurse practitioner, Smitty KnudsenKatherine Skiff instructed psr (Myself) to tell patient to go to the ER. PSR (myself) verbalized to patient instructions given by nurse practitioner. Patient verbalized understanding. Patient states that she will go to ER.

## 2015-01-26 ENCOUNTER — Emergency Department
Admission: EM | Admit: 2015-01-26 | Discharge: 2015-01-26 | Disposition: A | Discharge status: Home / Self Care | Payer: Medicare Other | Attending: Emergency Medicine | Admitting: Emergency Medicine

## 2015-01-26 ENCOUNTER — Emergency Department: Payer: Medicare Other

## 2015-01-26 ENCOUNTER — Encounter: Payer: Self-pay | Admitting: Emergency Medicine

## 2015-01-26 DIAGNOSIS — S0990XA Unspecified injury of head, initial encounter: Secondary | ICD-10-CM | POA: Insufficient documentation

## 2015-01-26 DIAGNOSIS — S0993XA Unspecified injury of face, initial encounter: Secondary | ICD-10-CM | POA: Diagnosis present

## 2015-01-26 DIAGNOSIS — S199XXA Unspecified injury of neck, initial encounter: Secondary | ICD-10-CM | POA: Diagnosis not present

## 2015-01-26 DIAGNOSIS — T07XXXA Unspecified multiple injuries, initial encounter: Secondary | ICD-10-CM

## 2015-01-26 DIAGNOSIS — S29001A Unspecified injury of muscle and tendon of front wall of thorax, initial encounter: Secondary | ICD-10-CM | POA: Insufficient documentation

## 2015-01-26 DIAGNOSIS — S022XXA Fracture of nasal bones, initial encounter for closed fracture: Secondary | ICD-10-CM | POA: Insufficient documentation

## 2015-01-26 DIAGNOSIS — Y9389 Activity, other specified: Secondary | ICD-10-CM | POA: Insufficient documentation

## 2015-01-26 DIAGNOSIS — F17200 Nicotine dependence, unspecified, uncomplicated: Secondary | ICD-10-CM | POA: Diagnosis not present

## 2015-01-26 DIAGNOSIS — Y9289 Other specified places as the place of occurrence of the external cause: Secondary | ICD-10-CM | POA: Diagnosis not present

## 2015-01-26 DIAGNOSIS — T7491XA Unspecified adult maltreatment, confirmed, initial encounter: Secondary | ICD-10-CM

## 2015-01-26 DIAGNOSIS — S40011A Contusion of right shoulder, initial encounter: Secondary | ICD-10-CM | POA: Diagnosis not present

## 2015-01-26 DIAGNOSIS — Z880 Allergy status to penicillin: Secondary | ICD-10-CM | POA: Diagnosis not present

## 2015-01-26 DIAGNOSIS — R52 Pain, unspecified: Secondary | ICD-10-CM

## 2015-01-26 DIAGNOSIS — S60221A Contusion of right hand, initial encounter: Secondary | ICD-10-CM | POA: Insufficient documentation

## 2015-01-26 DIAGNOSIS — Y998 Other external cause status: Secondary | ICD-10-CM | POA: Diagnosis not present

## 2015-01-26 MED ORDER — HYDROCODONE-ACETAMINOPHEN 5-325 MG PO TABS
1.0000 | ORAL_TABLET | Freq: Once | ORAL | Status: AC
  Administered 2015-01-26: 1 via ORAL
  Filled 2015-01-26: qty 1

## 2015-01-26 MED ORDER — HYDROCODONE-ACETAMINOPHEN 5-325 MG PO TABS: 1.0000 | ORAL_TABLET | ORAL | Status: AC | PRN
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
## 2015-01-26 NOTE — Discharge Instructions (Signed)
Cryotherapy Cryotherapy is when you put ice on your injury. Ice helps lessen pain and puffiness (swelling) after an injury. Ice works the best when you start using it in the first 24 to 48 hours after an injury. HOME CARE  Put a dry or damp towel between the ice pack and your skin.  You may press gently on the ice pack.  Leave the ice on for no more than 10 to 20 minutes at a time.  Check your skin after 5 minutes to make sure your skin is okay.  Rest at least 20 minutes between ice pack uses.  Stop using ice when your skin loses feeling (numbness).  Do not use ice on someone who cannot tell you when it hurts. This includes small children and people with memory problems (dementia). GET HELP RIGHT AWAY IF:  You have white spots on your skin.  Your skin turns blue or pale.  Your skin feels waxy or hard.  Your puffiness gets worse. MAKE SURE YOU:   Understand these instructions.  Will watch your condition.  Will get help right away if you are not doing well or get worse.   This information is not intended to replace advice given to you by your health care provider. Make sure you discuss any questions you have with your health care provider.   Document Released: 06/17/2007 Document Revised: 03/23/2011 Document Reviewed: 08/21/2010 Elsevier Interactive Patient Education [employer]2016 Elsevier Inc.    Follow-up with Dr. Willeen CassBennett if any continued problems with her nose fracture. Norco as needed for severe pain only as directed. Ice packs to face or bruises as needed for discomfort and swelling.
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
## 2015-11-28 ENCOUNTER — Encounter: Payer: Self-pay | Admitting: *Deleted

## 2015-11-28 ENCOUNTER — Emergency Department
Admission: EM | Admit: 2015-11-28 | Discharge: 2015-11-28 | Disposition: A | Discharge status: Home / Self Care | Payer: Medicare Other | Attending: Emergency Medicine | Admitting: Emergency Medicine

## 2015-11-28 DIAGNOSIS — T782XXA Anaphylactic shock, unspecified, initial encounter: Secondary | ICD-10-CM

## 2015-11-28 DIAGNOSIS — J45909 Unspecified asthma, uncomplicated: Secondary | ICD-10-CM | POA: Insufficient documentation

## 2015-11-28 DIAGNOSIS — T7840XA Allergy, unspecified, initial encounter: Secondary | ICD-10-CM | POA: Diagnosis present

## 2015-11-28 DIAGNOSIS — Z87891 Personal history of nicotine dependence: Secondary | ICD-10-CM | POA: Insufficient documentation

## 2015-11-28 HISTORY — DX: Fibromyalgia: M79.7

## 2015-11-28 HISTORY — DX: Unspecified osteoarthritis, unspecified site: M19.90

## 2015-11-28 MED ORDER — PREDNISONE 20 MG PO TABS
60.0000 mg | ORAL_TABLET | Freq: Once | ORAL | Status: AC
  Administered 2015-11-28: 60 mg via ORAL

## 2015-11-28 MED ORDER — EPINEPHRINE 0.3 MG/0.3ML IJ SOAJ
INTRAMUSCULAR | Status: AC
  Administered 2015-11-28: 0.3 mg via INTRAMUSCULAR
  Filled 2015-11-28: qty 0.3

## 2015-11-28 MED ORDER — EPINEPHRINE 0.3 MG/0.3ML IJ SOAJ
0.3000 mg | Freq: Once | INTRAMUSCULAR | Status: AC
  Administered 2015-11-28: 0.3 mg via INTRAMUSCULAR

## 2015-11-28 MED ORDER — IBUPROFEN 600 MG PO TABS
ORAL_TABLET | ORAL | Status: AC
  Filled 2015-11-28: qty 1

## 2015-11-28 MED ORDER — EPINEPHRINE 0.3 MG/0.3ML IJ SOAJ: 0.3000 mg | Freq: Once | INTRAMUSCULAR | 0 refills | Status: AC

## 2015-11-28 MED ORDER — PREDNISONE 20 MG PO TABS: ORAL_TABLET | ORAL | 0 refills | Status: AC

## 2015-11-28 MED ORDER — FAMOTIDINE 20 MG PO TABS
20.0000 mg | ORAL_TABLET | Freq: Once | ORAL | Status: AC
  Administered 2015-11-28: 20 mg via ORAL
  Filled 2015-11-28: qty 1

## 2015-11-28 MED ORDER — IBUPROFEN 600 MG PO TABS
600.0000 mg | ORAL_TABLET | Freq: Once | ORAL | Status: AC
  Administered 2015-11-28: 600 mg via ORAL

## 2015-11-28 MED ORDER — PREDNISONE 20 MG PO TABS
ORAL_TABLET | ORAL | Status: AC
  Administered 2015-11-28: 60 mg via ORAL
  Filled 2015-11-28: qty 3
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
## 2015-11-28 NOTE — ED Provider Notes (Signed)
  Physical Exam  BP 115/73   Pulse 71   Temp 98.4 F (36.9 C)   Resp 16   Wt 170 lb (77.1 kg)   SpO2 97%   Physical Exam  ED Course  Procedures  MDM Care assumed at 3:30pm. Patient had allergic reaction after bee sting. Given epi around 2 pm and steroids and pepcid. Apparently was allergic to benadryl so benadryl was held. Observed for 3 hrs in the ED and has no throat swelling or wheezing currently. Breathing comfortably. Will dc home with prednisone taper. Will prescribe epi pen.       Charlynne Panderavid Hsienta Aycen Porreca, MD 11/28/15 [phone]

## 2015-11-28 NOTE — ED Notes (Signed)
Pt reports being stung by a bee on the back of her neck, very small red area noted, pt states she is allergic to bees, pt states" I feel like my throat is swollen" epi pen given with relief per MD order, pt is requesting pain medication, Dr.Kinner notified
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
## 2015-11-28 NOTE — Discharge Instructions (Signed)
Carry Epi pen with you at all times. Give yourself a shot if you have trouble breathing, throat closing.   Take prednisone as prescribed.   See your doctor  Return to ER if you have trouble breathing, throat closing, rash.

## 2015-11-29 ENCOUNTER — Encounter: Payer: Self-pay | Admitting: Emergency Medicine

## 2015-12-25 ENCOUNTER — Encounter: Payer: Self-pay | Admitting: Emergency Medicine

## 2015-12-25 ENCOUNTER — Emergency Department
Admission: EM | Admit: 2015-12-25 | Discharge: 2015-12-25 | Disposition: A | Discharge status: Home / Self Care | Payer: Medicare Other | Attending: Emergency Medicine | Admitting: Emergency Medicine

## 2015-12-25 ENCOUNTER — Emergency Department: Payer: Medicare Other

## 2015-12-25 DIAGNOSIS — Y939 Activity, unspecified: Secondary | ICD-10-CM | POA: Diagnosis not present

## 2015-12-25 DIAGNOSIS — T07XXXA Unspecified multiple injuries, initial encounter: Secondary | ICD-10-CM

## 2015-12-25 DIAGNOSIS — S60031A Contusion of right middle finger without damage to nail, initial encounter: Secondary | ICD-10-CM | POA: Diagnosis not present

## 2015-12-25 DIAGNOSIS — R109 Unspecified abdominal pain: Secondary | ICD-10-CM | POA: Insufficient documentation

## 2015-12-25 DIAGNOSIS — Y92009 Unspecified place in unspecified non-institutional (private) residence as the place of occurrence of the external cause: Secondary | ICD-10-CM | POA: Insufficient documentation

## 2015-12-25 DIAGNOSIS — W19XXXA Unspecified fall, initial encounter: Secondary | ICD-10-CM | POA: Insufficient documentation

## 2015-12-25 DIAGNOSIS — S6991XA Unspecified injury of right wrist, hand and finger(s), initial encounter: Secondary | ICD-10-CM | POA: Diagnosis present

## 2015-12-25 DIAGNOSIS — J45909 Unspecified asthma, uncomplicated: Secondary | ICD-10-CM | POA: Diagnosis not present

## 2015-12-25 DIAGNOSIS — Z79899 Other long term (current) drug therapy: Secondary | ICD-10-CM | POA: Insufficient documentation

## 2015-12-25 DIAGNOSIS — Z87891 Personal history of nicotine dependence: Secondary | ICD-10-CM | POA: Insufficient documentation

## 2015-12-25 DIAGNOSIS — S60221A Contusion of right hand, initial encounter: Secondary | ICD-10-CM | POA: Diagnosis not present

## 2015-12-25 DIAGNOSIS — Y999 Unspecified external cause status: Secondary | ICD-10-CM | POA: Diagnosis not present

## 2015-12-25 DIAGNOSIS — J069 Acute upper respiratory infection, unspecified: Secondary | ICD-10-CM | POA: Insufficient documentation

## 2015-12-25 HISTORY — DX: Polyneuropathy, unspecified: G62.9

## 2015-12-25 LAB — BASIC METABOLIC PANEL
ANION GAP: 9 (ref 5–15)
BUN: 17 mg/dL (ref 6–20)
CALCIUM: 9.7 mg/dL (ref 8.9–10.3)
CO2: 26 mmol/L (ref 22–32)
Chloride: 104 mmol/L (ref 101–111)
Creatinine, Ser: 0.82 mg/dL (ref 0.44–1.00)
GLUCOSE: 134 mg/dL — AB (ref 65–99)
POTASSIUM: 3.8 mmol/L (ref 3.5–5.1)
SODIUM: 139 mmol/L (ref 135–145)

## 2015-12-25 LAB — URINALYSIS, COMPLETE (UACMP) WITH MICROSCOPIC
BACTERIA UA: NONE SEEN
Bilirubin Urine: NEGATIVE
Glucose, UA: NEGATIVE mg/dL
Hgb urine dipstick: NEGATIVE
KETONES UR: NEGATIVE mg/dL
Nitrite: NEGATIVE
PROTEIN: NEGATIVE mg/dL
Specific Gravity, Urine: 1.021 (ref 1.005–1.030)
pH: 7 (ref 5.0–8.0)

## 2015-12-25 LAB — CBC
HCT: 42.4 % (ref 35.0–47.0)
HEMOGLOBIN: 14.6 g/dL (ref 12.0–16.0)
MCH: 31.7 pg (ref 26.0–34.0)
MCHC: 34.3 g/dL (ref 32.0–36.0)
MCV: 92.3 fL (ref 80.0–100.0)
Platelets: 265 10*3/uL (ref 150–440)
RBC: 4.6 MIL/uL (ref 3.80–5.20)
RDW: 13.5 % (ref 11.5–14.5)
WBC: 12.1 10*3/uL — AB (ref 3.6–11.0)

## 2015-12-25 MED ORDER — NAPROXEN 500 MG PO TABS: 500.0000 mg | ORAL_TABLET | Freq: Two times a day (BID) | ORAL | 0 refills | Status: AC

## 2015-12-25 NOTE — ED Triage Notes (Signed)
Pt presents with right flank pain and pain to right hand, knee, hip, leg after falling earlier today. 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 Pt has extensive hx of fibromyalgia, neuropathy, RA, degenerative disk disease, pain issues. Pt is going to have surgery related to pain management in January (something to do with neck and spine). Pt tearful in triage.
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
## 2015-12-25 NOTE — ED Notes (Signed)
Called in lobby at [phone].

## 2016-02-04 ENCOUNTER — Emergency Department
Admission: EM | Admit: 2016-02-04 | Discharge: 2016-02-04 | Disposition: A | Discharge status: Home / Self Care | Payer: Medicare Other | Attending: Emergency Medicine | Admitting: Emergency Medicine

## 2016-02-04 ENCOUNTER — Encounter: Payer: Self-pay | Admitting: Emergency Medicine

## 2016-02-04 ENCOUNTER — Emergency Department: Payer: Medicare Other

## 2016-02-04 DIAGNOSIS — Z87891 Personal history of nicotine dependence: Secondary | ICD-10-CM | POA: Diagnosis not present

## 2016-02-04 DIAGNOSIS — J45909 Unspecified asthma, uncomplicated: Secondary | ICD-10-CM | POA: Insufficient documentation

## 2016-02-04 DIAGNOSIS — Y999 Unspecified external cause status: Secondary | ICD-10-CM | POA: Diagnosis not present

## 2016-02-04 DIAGNOSIS — Y939 Activity, unspecified: Secondary | ICD-10-CM | POA: Insufficient documentation

## 2016-02-04 DIAGNOSIS — Y929 Unspecified place or not applicable: Secondary | ICD-10-CM | POA: Insufficient documentation

## 2016-02-04 DIAGNOSIS — R569 Unspecified convulsions: Secondary | ICD-10-CM | POA: Insufficient documentation

## 2016-02-04 DIAGNOSIS — S0990XA Unspecified injury of head, initial encounter: Secondary | ICD-10-CM | POA: Diagnosis present

## 2016-02-04 LAB — BASIC METABOLIC PANEL
ANION GAP: UNDETERMINED (ref 5–15)
BUN: 11 mg/dL (ref 6–20)
CALCIUM: 8.7 mg/dL — AB (ref 8.9–10.3)
CO2: 25 mmol/L (ref 22–32)
Chloride: 109 mmol/L (ref 101–111)
Creatinine, Ser: 0.76 mg/dL (ref 0.44–1.00)
GLUCOSE: 117 mg/dL — AB (ref 65–99)
POTASSIUM: 3.3 mmol/L — AB (ref 3.5–5.1)
Sodium: UNDETERMINED mmol/L (ref 135–145)

## 2016-02-04 LAB — CBC
HEMATOCRIT: 37.4 % (ref 35.0–47.0)
Hemoglobin: 13.5 g/dL (ref 12.0–16.0)
MCH: 33 pg (ref 26.0–34.0)
MCHC: 36.2 g/dL — ABNORMAL HIGH (ref 32.0–36.0)
MCV: 91.1 fL (ref 80.0–100.0)
Platelets: 235 10*3/uL (ref 150–440)
RBC: 4.1 MIL/uL (ref 3.80–5.20)
RDW: 13.7 % (ref 11.5–14.5)
WBC: 12.8 10*3/uL — AB (ref 3.6–11.0)

## 2016-02-04 MED ORDER — CYCLOBENZAPRINE HCL 5 MG PO TABS: 5.0000 mg | ORAL_TABLET | Freq: Three times a day (TID) | ORAL | 0 refills | Status: AC | PRN

## 2016-02-04 MED ORDER — LEVETIRACETAM 500 MG PO TABS
500.0000 mg | ORAL_TABLET | Freq: Once | ORAL | Status: AC
  Administered 2016-02-04: 500 mg via ORAL
  Filled 2016-02-04: qty 1

## 2016-02-04 MED ORDER — OXYCODONE-ACETAMINOPHEN 5-325 MG PO TABS
1.0000 | ORAL_TABLET | Freq: Once | ORAL | Status: AC
  Administered 2016-02-04: 1 via ORAL
  Filled 2016-02-04: qty 1
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
## 2016-02-04 NOTE — ED Notes (Signed)
Seizure pads in place at this time.
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
## 2016-02-04 NOTE — ED Notes (Signed)
Lab called to stat not enough urine received for lab test ordered

## 2016-02-04 NOTE — Discharge Instructions (Signed)
Please follow up with your primary care physician.

## 2016-10-14 ENCOUNTER — Encounter: Attending: Family | Primary: Family

## 2017-06-15 ENCOUNTER — Encounter: Payer: Self-pay | Admitting: Emergency Medicine

## 2017-06-15 ENCOUNTER — Emergency Department
Admission: EM | Admit: 2017-06-15 | Discharge: 2017-06-15 | Disposition: A | Discharge status: Home / Self Care | Payer: Medicare HMO | Attending: Emergency Medicine | Admitting: Emergency Medicine

## 2017-06-15 DIAGNOSIS — Z87891 Personal history of nicotine dependence: Secondary | ICD-10-CM | POA: Diagnosis not present

## 2017-06-15 DIAGNOSIS — G8929 Other chronic pain: Secondary | ICD-10-CM | POA: Insufficient documentation

## 2017-06-15 DIAGNOSIS — J45909 Unspecified asthma, uncomplicated: Secondary | ICD-10-CM | POA: Diagnosis not present

## 2017-06-15 DIAGNOSIS — M549 Dorsalgia, unspecified: Secondary | ICD-10-CM | POA: Diagnosis not present

## 2017-06-15 DIAGNOSIS — Z79899 Other long term (current) drug therapy: Secondary | ICD-10-CM | POA: Insufficient documentation

## 2017-06-15 MED ORDER — OXYCODONE HCL 5 MG PO TABS
5.0000 mg | ORAL_TABLET | Freq: Once | ORAL | Status: AC
  Administered 2017-06-15: 5 mg via ORAL
  Filled 2017-06-15: qty 1
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
## 2017-06-15 NOTE — ED Notes (Signed)
Pt very upset during discharge when given only 5mg  oxycodone, stating she normally takes 120mg  of morphine a day and that she had to wait for 4 hrs, that she would never be back to the this hospital again. EDP is aware..Marland Kitchen

## 2017-07-11 ENCOUNTER — Other Ambulatory Visit: Payer: Self-pay

## 2017-07-11 ENCOUNTER — Emergency Department
Admission: EM | Admit: 2017-07-11 | Discharge: 2017-07-11 | Disposition: A | Discharge status: Home / Self Care | Payer: Medicare HMO | Attending: Emergency Medicine | Admitting: Emergency Medicine

## 2017-07-11 ENCOUNTER — Emergency Department: Payer: Medicare HMO

## 2017-07-11 ENCOUNTER — Encounter: Payer: Self-pay | Admitting: Emergency Medicine

## 2017-07-11 DIAGNOSIS — R519 Headache, unspecified: Secondary | ICD-10-CM

## 2017-07-11 DIAGNOSIS — R079 Chest pain, unspecified: Secondary | ICD-10-CM

## 2017-07-11 DIAGNOSIS — J45909 Unspecified asthma, uncomplicated: Secondary | ICD-10-CM | POA: Insufficient documentation

## 2017-07-11 DIAGNOSIS — Z79899 Other long term (current) drug therapy: Secondary | ICD-10-CM | POA: Diagnosis not present

## 2017-07-11 DIAGNOSIS — R51 Headache: Secondary | ICD-10-CM | POA: Diagnosis not present

## 2017-07-11 DIAGNOSIS — R0789 Other chest pain: Secondary | ICD-10-CM | POA: Insufficient documentation

## 2017-07-11 DIAGNOSIS — Z87891 Personal history of nicotine dependence: Secondary | ICD-10-CM | POA: Diagnosis not present

## 2017-07-11 LAB — BASIC METABOLIC PANEL
ANION GAP: 8 (ref 5–15)
BUN: 8 mg/dL (ref 6–20)
CALCIUM: 9.5 mg/dL (ref 8.9–10.3)
CHLORIDE: 108 mmol/L (ref 98–111)
CO2: 25 mmol/L (ref 22–32)
CREATININE: 0.75 mg/dL (ref 0.44–1.00)
GFR calc Af Amer: >60 mL/min (ref >60)
Glucose, Bld: 109 mg/dL — ABNORMAL HIGH (ref 70–99)
Potassium: 4.2 mmol/L (ref 3.5–5.1)
Sodium: 141 mmol/L (ref 135–145)

## 2017-07-11 LAB — CBC WITH DIFFERENTIAL/PLATELET
BASOS ABS: 0.1 10*3/uL (ref 0–0.1)
Basophils Relative: 1 %
EOS PCT: 4 %
Eosinophils Absolute: 0.4 10*3/uL (ref 0–0.7)
HEMATOCRIT: 45.7 % (ref 35.0–47.0)
Hemoglobin: 15.3 g/dL (ref 12.0–16.0)
LYMPHS PCT: 33 %
Lymphs Abs: 3.8 10*3/uL — ABNORMAL HIGH (ref 1.0–3.6)
MCH: 30.9 pg (ref 26.0–34.0)
MCHC: 33.5 g/dL (ref 32.0–36.0)
MCV: 92.3 fL (ref 80.0–100.0)
MONO ABS: 0.7 10*3/uL (ref 0.2–0.9)
MONOS PCT: 7 %
NEUTROS ABS: 6.4 10*3/uL (ref 1.4–6.5)
Neutrophils Relative %: 55 %
PLATELETS: 285 10*3/uL (ref 150–440)
RBC: 4.95 MIL/uL (ref 3.80–5.20)
RDW: 14.6 % — AB (ref 11.5–14.5)
WBC: 11.4 10*3/uL — ABNORMAL HIGH (ref 3.6–11.0)

## 2017-07-11 LAB — TROPONIN I
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
## 2017-07-11 NOTE — ED Triage Notes (Signed)
Pt presents to ED via [facility] with c/o CP substernal since 11am, worse with palpation. Per EMS pt has a hx of Lyme, lupus, brittle bone disease, CHF, fibromyalgia, and cluster HA. Per EMS pt has had cluster HA x 3 days.
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
## 2017-07-13 ENCOUNTER — Telehealth: Payer: Self-pay

## 2017-07-13 NOTE — Telephone Encounter (Signed)
Called patient to schedule ED fu  She stated "do not ever call her back"  She states the ER was the worst place to go. That she wouldn't even send her dog there. She then proceeded to hang up

## 2020-03-03 ENCOUNTER — Other Ambulatory Visit: Payer: Self-pay

## 2020-03-03 ENCOUNTER — Emergency Department
Admission: EM | Admit: 2020-03-03 | Discharge: 2020-03-03 | Disposition: A | Discharge status: Home / Self Care | Payer: Medicare HMO | Attending: Emergency Medicine | Admitting: Emergency Medicine

## 2020-03-03 ENCOUNTER — Emergency Department: Payer: Medicare HMO

## 2020-03-03 DIAGNOSIS — R1032 Left lower quadrant pain: Secondary | ICD-10-CM | POA: Diagnosis present

## 2020-03-03 DIAGNOSIS — Z87891 Personal history of nicotine dependence: Secondary | ICD-10-CM | POA: Diagnosis not present

## 2020-03-03 DIAGNOSIS — K529 Noninfective gastroenteritis and colitis, unspecified: Secondary | ICD-10-CM | POA: Diagnosis not present

## 2020-03-03 DIAGNOSIS — R1084 Generalized abdominal pain: Secondary | ICD-10-CM

## 2020-03-03 DIAGNOSIS — J45909 Unspecified asthma, uncomplicated: Secondary | ICD-10-CM | POA: Insufficient documentation

## 2020-03-03 LAB — COMPREHENSIVE METABOLIC PANEL
ALT: 17 U/L (ref 0–44)
AST: 29 U/L (ref 15–41)
Albumin: 4.2 g/dL (ref 3.5–5.0)
Alkaline Phosphatase: 81 U/L (ref 38–126)
Anion gap: 8 (ref 5–15)
BUN: 13 mg/dL (ref 6–20)
CO2: 21 mmol/L — ABNORMAL LOW (ref 22–32)
Calcium: 9.2 mg/dL (ref 8.9–10.3)
Chloride: 97 mmol/L — ABNORMAL LOW (ref 98–111)
Creatinine, Ser: 0.61 mg/dL (ref 0.44–1.00)
GFR, Estimated: >60 mL/min (ref >60)
Glucose, Bld: 90 mg/dL (ref 70–99)
Potassium: 4.2 mmol/L (ref 3.5–5.1)
Sodium: 126 mmol/L — ABNORMAL LOW (ref 135–145)
Total Bilirubin: 0.8 mg/dL (ref 0.3–1.2)
Total Protein: 7.9 g/dL (ref 6.5–8.1)

## 2020-03-03 LAB — CBC
HCT: 37 % (ref 36.0–46.0)
Hemoglobin: 13 g/dL (ref 12.0–15.0)
MCH: 32 pg (ref 26.0–34.0)
MCHC: 35.1 g/dL (ref 30.0–36.0)
MCV: 91.1 fL (ref 80.0–100.0)
Platelets: 253 10*3/uL (ref 150–400)
RBC: 4.06 MIL/uL (ref 3.87–5.11)
RDW: 13.8 % (ref 11.5–15.5)
WBC: 8.6 10*3/uL (ref 4.0–10.5)
nRBC: 0 % (ref 0.0–0.2)

## 2020-03-03 LAB — LIPASE, BLOOD: Lipase: 29 U/L (ref 11–51)

## 2020-03-03 LAB — URINALYSIS, COMPLETE (UACMP) WITH MICROSCOPIC
Bilirubin Urine: NEGATIVE
Glucose, UA: NEGATIVE mg/dL
Hgb urine dipstick: NEGATIVE
Ketones, ur: NEGATIVE mg/dL
Nitrite: NEGATIVE
Protein, ur: NEGATIVE mg/dL
Specific Gravity, Urine: 1.015 (ref 1.005–1.030)
pH: 7 (ref 5.0–8.0)

## 2020-03-03 LAB — POC URINE PREG, ED: Preg Test, Ur: NEGATIVE

## 2020-03-03 LAB — HCG, QUANTITATIVE, PREGNANCY: hCG, Beta Chain, Quant, S: <1 m[IU]/mL (ref <5)

## 2020-03-03 MED ORDER — ONDANSETRON 4 MG PO TBDP: 4.0000 mg | ORAL_TABLET | Freq: Three times a day (TID) | ORAL | 0 refills | Status: AC | PRN

## 2020-03-03 MED ORDER — MORPHINE SULFATE (PF) 4 MG/ML IV SOLN
4.0000 mg | Freq: Once | INTRAVENOUS | Status: AC
  Administered 2020-03-03: 4 mg via INTRAVENOUS
  Filled 2020-03-03: qty 1

## 2020-03-03 MED ORDER — LACTATED RINGERS IV BOLUS
1000.0000 mL | Freq: Once | INTRAVENOUS | Status: AC
  Administered 2020-03-03: 1000 mL via INTRAVENOUS

## 2020-03-03 MED ORDER — OXYCODONE-ACETAMINOPHEN 5-325 MG PO TABS
1.0000 | ORAL_TABLET | Freq: Once | ORAL | Status: AC
  Administered 2020-03-03: 1 via ORAL
  Filled 2020-03-03: qty 1

## 2020-03-03 MED ORDER — ONDANSETRON HCL 4 MG/2ML IJ SOLN
4.0000 mg | Freq: Once | INTRAMUSCULAR | Status: AC
  Administered 2020-03-03: 4 mg via INTRAVENOUS
  Filled 2020-03-03: qty 2

## 2020-03-03 MED ORDER — IOHEXOL 300 MG/ML  SOLN
100.0000 mL | Freq: Once | INTRAMUSCULAR | Status: AC | PRN
  Administered 2020-03-03: 100 mL via INTRAVENOUS

## 2020-03-03 NOTE — ED Triage Notes (Signed)
Pt states that she woke up at 2AM with excruciating pain in her left abdomin- pt states she has had vomiting and diarrhea with it- pt tearful in triage room
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
## 2020-08-13 DIAGNOSIS — M5417 Radiculopathy, lumbosacral region: Secondary | ICD-10-CM | POA: Diagnosis not present

## 2020-08-13 DIAGNOSIS — M542 Cervicalgia: Secondary | ICD-10-CM | POA: Diagnosis not present

## 2020-08-13 DIAGNOSIS — Z5181 Encounter for therapeutic drug level monitoring: Secondary | ICD-10-CM | POA: Diagnosis not present

## 2020-08-13 DIAGNOSIS — G894 Chronic pain syndrome: Secondary | ICD-10-CM | POA: Diagnosis not present

## 2020-08-13 DIAGNOSIS — Z79891 Long term (current) use of opiate analgesic: Secondary | ICD-10-CM | POA: Diagnosis not present

## 2020-08-13 DIAGNOSIS — M5030 Other cervical disc degeneration, unspecified cervical region: Secondary | ICD-10-CM | POA: Diagnosis not present

## 2022-08-22 IMAGING — CT CT ABD-PELV W/ CM
2 of 5 series · 16 of 46 positions shown, 18 images · IV contrast (APPLIED)
Comparison: None.

CLINICAL DATA: 44-year-old female with concern for diverticulitis.
Left-sided abdominal pain.

EXAM:
CT ABDOMEN AND PELVIS WITH CONTRAST
TECHNIQUE: Multidetector CT imaging of the abdomen and pelvis was performed
using the standard protocol following bolus administration of
intravenous contrast.
CONTRAST:  100mL OMNIPAQUE IOHEXOL 300 MG/ML  SOLN

[Series 2: routine abd/pel with · axial · 0.82mm/px · z∈[-1010,-560]mm · 13 of 102 slices shown, 15 images]
[im 6/102  soft-tissue]
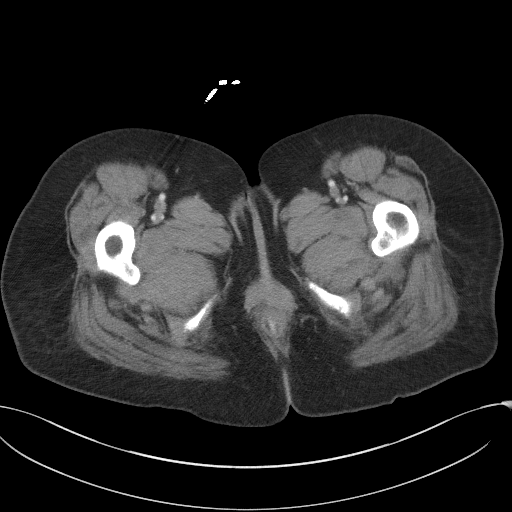
[im 6/102  bone]
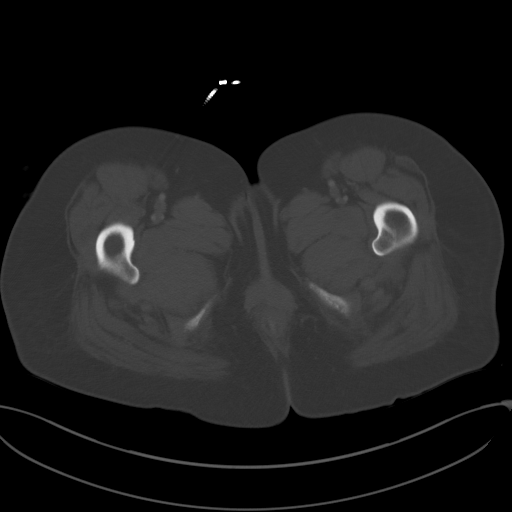
[im 16/102  soft-tissue]
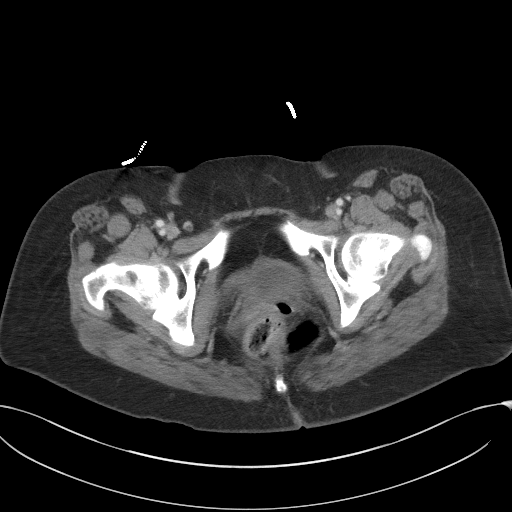
[im 22/102  soft-tissue]
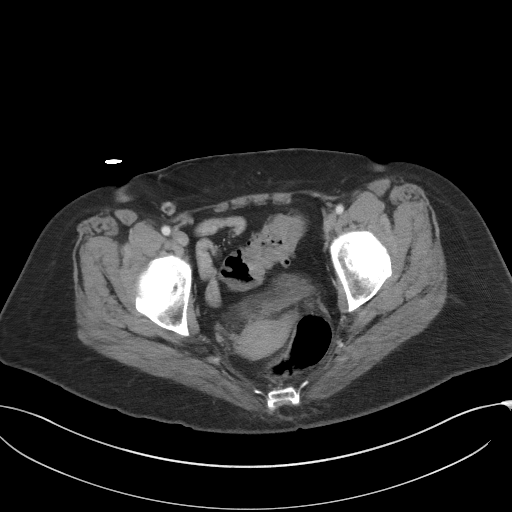
[im 27/102  soft-tissue]
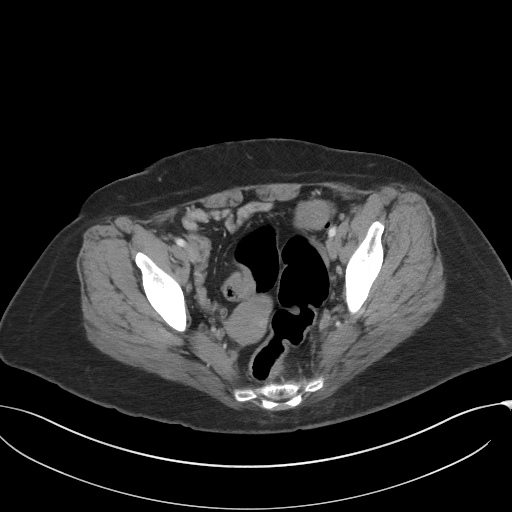
[im 38/102  soft-tissue]
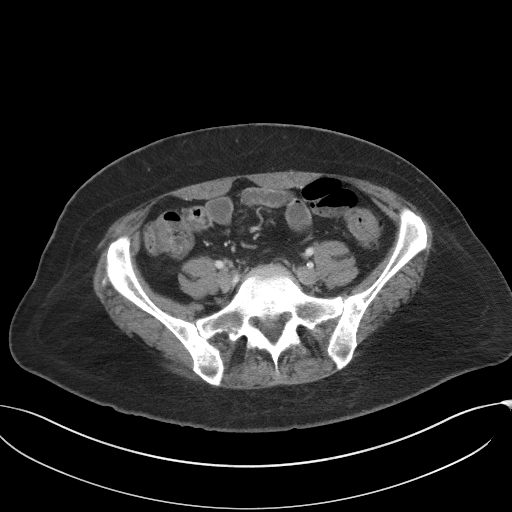
[im 43/102  soft-tissue]
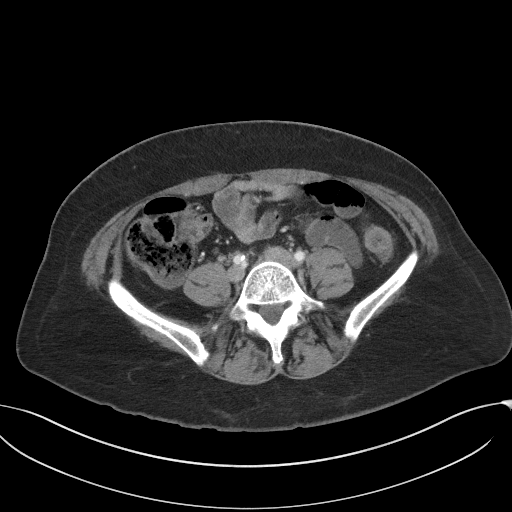
[im 54/102  soft-tissue]
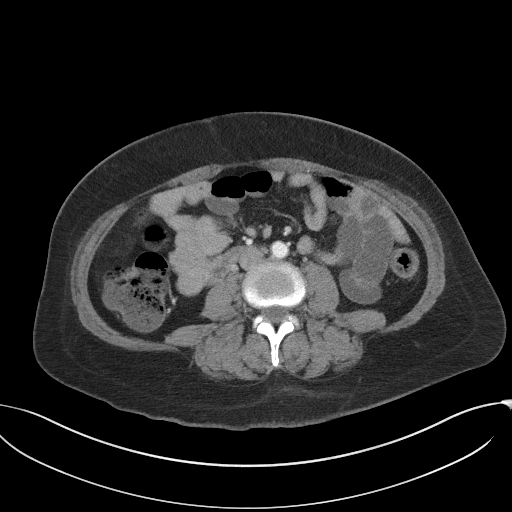
[im 59/102  soft-tissue]
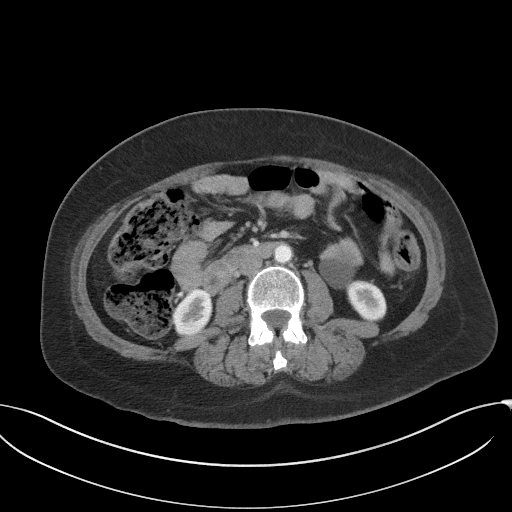
[im 64/102  soft-tissue]
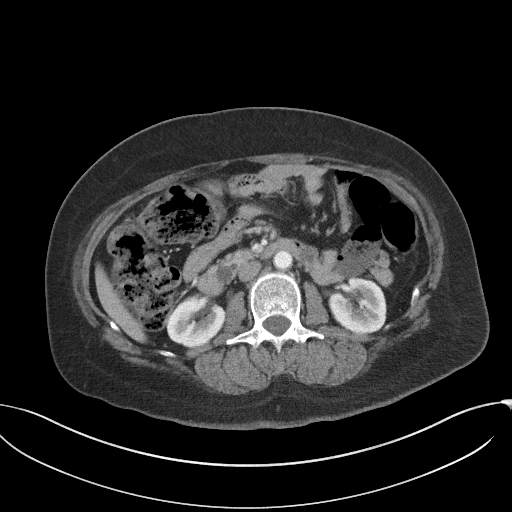
[im 64/102  bone]
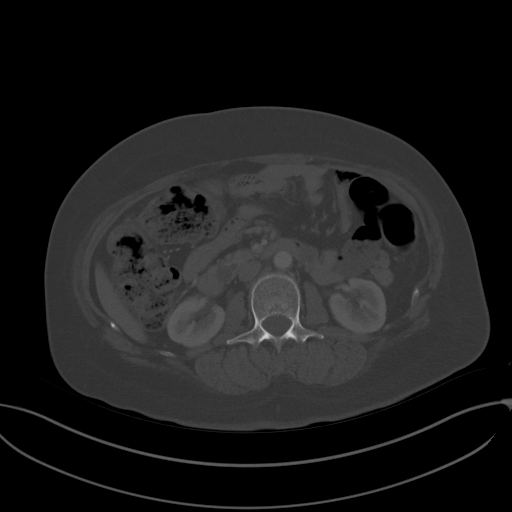
[im 75/102  soft-tissue]
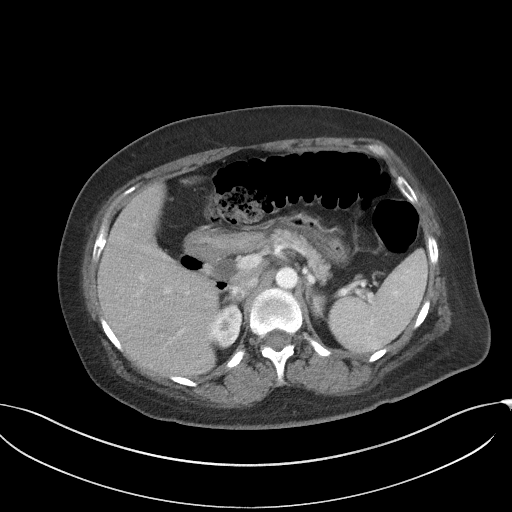
[im 80/102  soft-tissue]
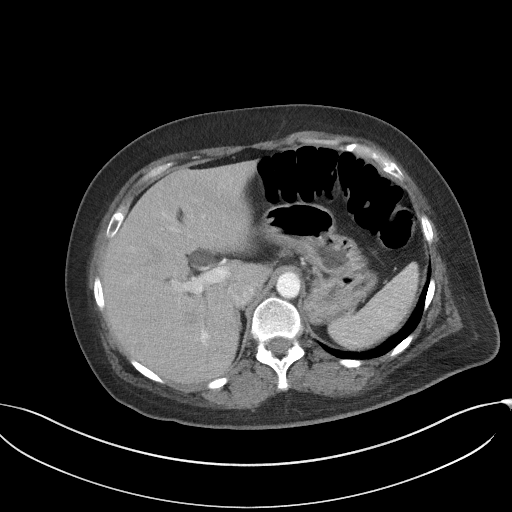
[im 86/102  soft-tissue]
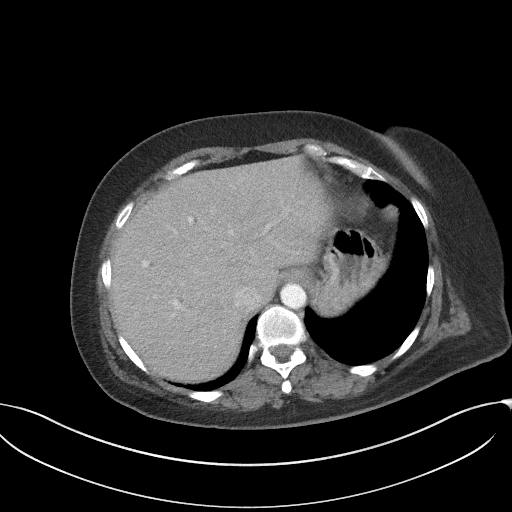
[im 96/102  soft-tissue]
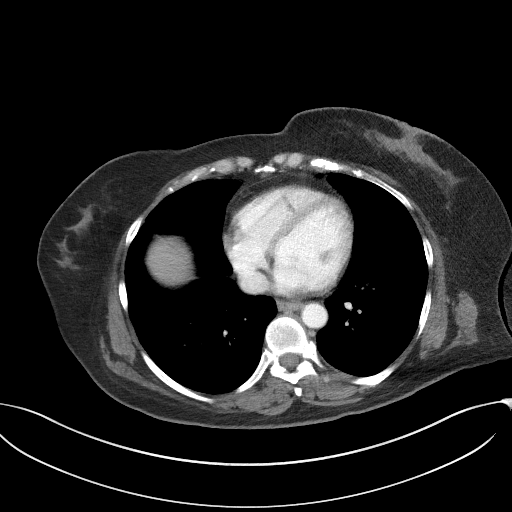

[Series 5: coronal st · coronal · 0.75mm/px · 3 of 86 slices shown]
[im 29/86  soft-tissue]
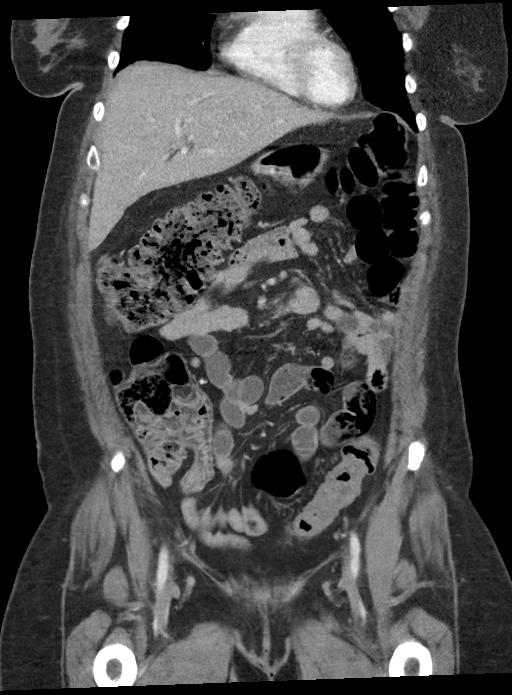
[im 38/86  soft-tissue]
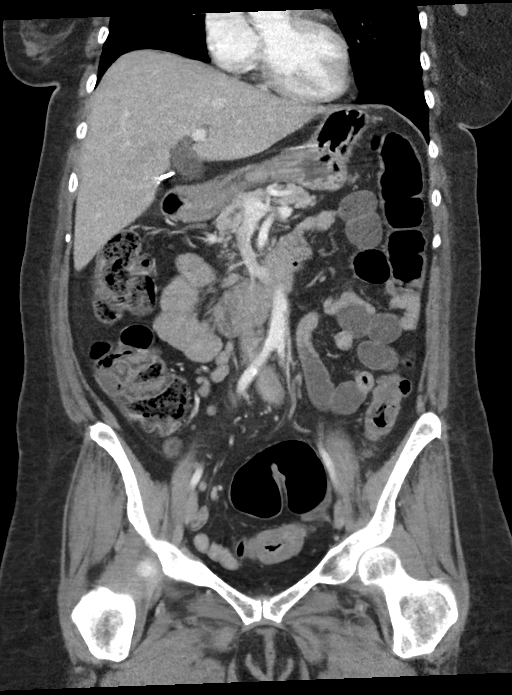
[im 48/86  soft-tissue]
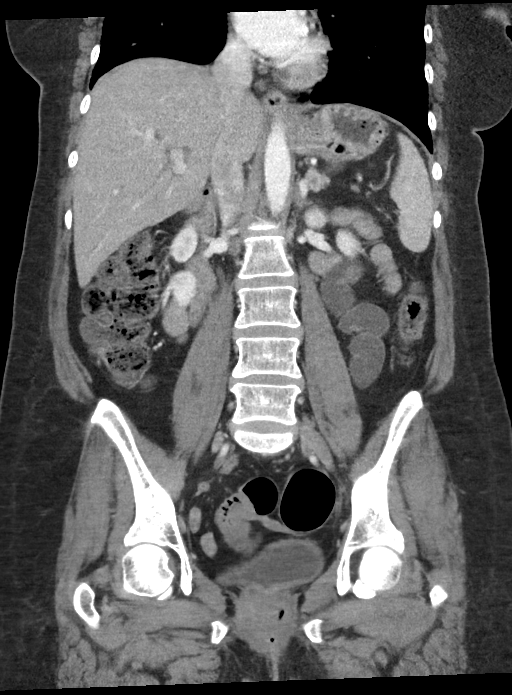

[16 of 46 positions shown; findings below may reference images not displayed]

FINDINGS: Lower chest: The visualized lung bases are clear.

No intra-abdominal free air or free fluid.

Hepatobiliary: The liver is unremarkable. There is biliary ductal
dilatation, likely post cholecystectomy. No retained calcified stone
noted in the central CBD.

Pancreas: Unremarkable and. No pancreatic ductal dilatation or
surrounding inflammatory changes.

Spleen: There is a 15 mm hypodense lesion in the anterior inferior
spleen which is not characterized but may represent a cyst or
hemangioma. The spleen is otherwise unremarkable.

Adrenals/Urinary Tract: The adrenal glands are unremarkable. There
is no hydronephrosis on either side. There is symmetric enhancement
and excretion of contrast by both kidneys. The visualized ureters
and urinary bladder appear unremarkable.

Stomach/Bowel: There is sigmoid diverticulosis. There is diffuse
segmental thickening involving the distal descending and sigmoid
colon favored to represent colitis and less likely diverticulitis.
Clinical correlation is recommended. There is no bowel obstruction.
The appendix is normal.

Vascular/Lymphatic: The abdominal aorta and IVC unremarkable. No
portal venous gas. There is no adenopathy.

Reproductive: The uterus is anteverted. Probable small posterior
fundal fibroid. No adnexal masses.

Other: None

Musculoskeletal: No acute or significant osseous findings.
IMPRESSION: Sigmoid diverticulosis. Diffuse segmental thickening involving the
distal descending and sigmoid colon favored to represent colitis and
less likely diverticulitis. Clinical correlation is recommended. No
bowel obstruction. Normal appendix.
